# Patient Record
Sex: Female | Born: 1935 | Race: White | Hispanic: No | Marital: Married | Smoking: Former smoker
Health system: Southern US, Community
[De-identification: ages and names within clinical notes are randomized; demographics above are authoritative.]

## PROBLEM LIST (undated history)

## (undated) DIAGNOSIS — M1711 Unilateral primary osteoarthritis, right knee: Secondary | ICD-10-CM

## (undated) DIAGNOSIS — I350 Nonrheumatic aortic (valve) stenosis: Secondary | ICD-10-CM

## (undated) DIAGNOSIS — K449 Diaphragmatic hernia without obstruction or gangrene: Secondary | ICD-10-CM

## (undated) DIAGNOSIS — R0600 Dyspnea, unspecified: Secondary | ICD-10-CM

## (undated) DIAGNOSIS — F419 Anxiety disorder, unspecified: Secondary | ICD-10-CM

## (undated) DIAGNOSIS — C4490 Unspecified malignant neoplasm of skin, unspecified: Secondary | ICD-10-CM

## (undated) DIAGNOSIS — D649 Anemia, unspecified: Secondary | ICD-10-CM

## (undated) DIAGNOSIS — K219 Gastro-esophageal reflux disease without esophagitis: Secondary | ICD-10-CM

## (undated) DIAGNOSIS — M8580 Other specified disorders of bone density and structure, unspecified site: Secondary | ICD-10-CM

## (undated) DIAGNOSIS — G4700 Insomnia, unspecified: Secondary | ICD-10-CM

## (undated) DIAGNOSIS — C519 Malignant neoplasm of vulva, unspecified: Secondary | ICD-10-CM

## (undated) DIAGNOSIS — I10 Essential (primary) hypertension: Secondary | ICD-10-CM

## (undated) HISTORY — DX: Malignant neoplasm of vulva, unspecified: C51.9

## (undated) HISTORY — DX: Insomnia, unspecified: G47.00

## (undated) HISTORY — DX: Anxiety disorder, unspecified: F41.9

## (undated) HISTORY — DX: Diaphragmatic hernia without obstruction or gangrene: K44.9

## (undated) HISTORY — DX: Unilateral primary osteoarthritis, right knee: M17.11

## (undated) HISTORY — DX: Other specified disorders of bone density and structure, unspecified site: M85.80

## (undated) HISTORY — PX: EYE SURGERY: SHX253

## (undated) HISTORY — PX: TONSILLECTOMY AND ADENOIDECTOMY: SUR1326

## (undated) HISTORY — DX: Gastro-esophageal reflux disease without esophagitis: K21.9

## (undated) HISTORY — PX: VULVA SURGERY: SHX837

## (undated) HISTORY — PX: COSMETIC SURGERY: SHX468

## (undated) HISTORY — PX: TUBAL LIGATION: SHX77

---

## 2008-11-04 ENCOUNTER — Ambulatory Visit: Payer: Self-pay | Admitting: Nurse Practitioner

## 2009-10-29 ENCOUNTER — Telehealth (INDEPENDENT_AMBULATORY_CARE_PROVIDER_SITE_OTHER): Payer: Self-pay | Admitting: *Deleted

## 2009-11-18 DIAGNOSIS — R0602 Shortness of breath: Secondary | ICD-10-CM | POA: Insufficient documentation

## 2009-11-19 ENCOUNTER — Ambulatory Visit: Payer: Self-pay | Admitting: Cardiology

## 2009-11-19 DIAGNOSIS — K219 Gastro-esophageal reflux disease without esophagitis: Secondary | ICD-10-CM | POA: Insufficient documentation

## 2009-11-19 DIAGNOSIS — R011 Cardiac murmur, unspecified: Secondary | ICD-10-CM | POA: Insufficient documentation

## 2009-11-22 LAB — CONVERTED CEMR LAB: Pro B Natriuretic peptide (BNP): 53 pg/mL (ref 0.0–100.0)

## 2009-12-01 ENCOUNTER — Telehealth (INDEPENDENT_AMBULATORY_CARE_PROVIDER_SITE_OTHER): Payer: Self-pay | Admitting: *Deleted

## 2009-12-10 ENCOUNTER — Ambulatory Visit: Payer: Self-pay

## 2009-12-10 ENCOUNTER — Encounter: Payer: Self-pay | Admitting: Cardiology

## 2009-12-10 ENCOUNTER — Ambulatory Visit: Payer: Self-pay | Admitting: Cardiology

## 2009-12-10 ENCOUNTER — Ambulatory Visit (HOSPITAL_COMMUNITY): Admission: RE | Admit: 2009-12-10 | Discharge: 2009-12-10 | Payer: Self-pay | Admitting: Cardiology

## 2009-12-24 ENCOUNTER — Telehealth: Payer: Self-pay | Admitting: Cardiology

## 2009-12-28 ENCOUNTER — Telehealth (INDEPENDENT_AMBULATORY_CARE_PROVIDER_SITE_OTHER): Payer: Self-pay | Admitting: Radiology

## 2009-12-29 ENCOUNTER — Telehealth: Payer: Self-pay | Admitting: Cardiology

## 2009-12-29 ENCOUNTER — Ambulatory Visit: Payer: Self-pay | Admitting: Cardiology

## 2009-12-29 ENCOUNTER — Encounter (HOSPITAL_COMMUNITY): Admission: RE | Admit: 2009-12-29 | Discharge: 2010-02-23 | Payer: Self-pay | Admitting: Cardiology

## 2009-12-29 ENCOUNTER — Ambulatory Visit: Payer: Self-pay

## 2009-12-29 DIAGNOSIS — E782 Mixed hyperlipidemia: Secondary | ICD-10-CM | POA: Insufficient documentation

## 2010-01-04 ENCOUNTER — Telehealth: Payer: Self-pay | Admitting: Cardiology

## 2010-01-05 ENCOUNTER — Encounter (INDEPENDENT_AMBULATORY_CARE_PROVIDER_SITE_OTHER): Payer: Self-pay | Admitting: *Deleted

## 2010-01-05 LAB — CONVERTED CEMR LAB
Cholesterol: 243 mg/dL — ABNORMAL HIGH (ref 0–200)
Direct LDL: 133.4 mg/dL
Total CHOL/HDL Ratio: 3
VLDL: 27.2 mg/dL (ref 0.0–40.0)

## 2010-01-22 ENCOUNTER — Telehealth (INDEPENDENT_AMBULATORY_CARE_PROVIDER_SITE_OTHER): Payer: Self-pay | Admitting: *Deleted

## 2010-01-28 ENCOUNTER — Encounter: Payer: Self-pay | Admitting: Cardiology

## 2010-02-23 ENCOUNTER — Ambulatory Visit: Payer: Self-pay | Admitting: Internal Medicine

## 2010-02-23 DIAGNOSIS — D649 Anemia, unspecified: Secondary | ICD-10-CM | POA: Insufficient documentation

## 2010-02-24 LAB — CONVERTED CEMR LAB
BUN: 13 mg/dL (ref 6–23)
Calcium: 9.4 mg/dL (ref 8.4–10.5)
Eosinophils Relative: 2 % (ref 0.0–5.0)
Glucose, Bld: 104 mg/dL — ABNORMAL HIGH (ref 70–99)
Iron: 91 ug/dL (ref 42–145)
Lymphs Abs: 1.9 10*3/uL (ref 0.7–4.0)
MCHC: 34.7 g/dL (ref 30.0–36.0)
MCV: 93.6 fL (ref 78.0–100.0)
Monocytes Relative: 7.3 % (ref 3.0–12.0)
Neutro Abs: 3.7 10*3/uL (ref 1.4–7.7)
Platelets: 243 10*3/uL (ref 150.0–400.0)
Potassium: 4.7 meq/L (ref 3.5–5.1)
RDW: 13.9 % (ref 11.5–14.6)
Saturation Ratios: 21.7 % (ref 20.0–50.0)
Transferrin: 299.6 mg/dL (ref 212.0–360.0)
WBC: 6.3 10*3/uL (ref 4.5–10.5)

## 2010-03-05 ENCOUNTER — Ambulatory Visit: Payer: Self-pay | Admitting: Cardiology

## 2010-09-28 NOTE — Letter (Signed)
Summary: Custom - Lipid  [facility], Main Office  [address]   [place], [place] [address]   Phone: [phone]  Fax: [phone]         Jan 05, 2010 MRN: [id number]   Mackenzie Jensen [address] [place], [place]  [address]   Dear [name],  We have reviewed your cholesterol results.  They are as follows:     Total Cholesterol:    243 (Desirable: less than 200)       HDL  Cholesterol:     72.90  (Desirable: greater than 40 for men and 50 for women)       LDL Cholesterol:         (Desirable: less than 100 for low risk and less than 70 for moderate to high risk)       Triglycerides:       136.0  (Desirable: less than 150)  Our recommendations include:  LOOKS GOOD - [facility] SUGGESTS DIET   Call our office at the number listed above if you have any questions.  Lowering your LDL cholesterol is important, but it is only one of a large number of "risk factors" that may indicate that you are at risk for heart disease, stroke or other complications of hardening of the arteries.  Other risk factors include:   A.  Cigarette Smoking* B.  High Blood Pressure* C.  Obesity* D.   Low HDL Cholesterol (see yours above)* E.   Diabetes Mellitus (higher risk if your is uncontrolled) F.  Family history of premature heart disease G.  Previous history of stroke or cardiovascular disease              *These are risk factors YOU HAVE CONTROL OVER.  For more information, visit .  There is now evidence that lowering the TOTAL CHOLESTEROL AND LDL CHOLESTEROL can reduce the risk of heart disease.  The American Heart Association recommends the following guidelines for the treatment of elevated cholesterol:  1.  If there is now current heart disease and less than two risk factors, TOTAL CHOLESTEROL should be less than 200 and LDL CHOLESTEROL should be less than 100. 2.  If there is current heart disease or two or more risk factors, TOTAL CHOLESTEROL should be less  than 200 and LDL CHOLESTEROL should be less than 70.  A diet low in cholesterol, saturated fat, and calories is the cornerstone of treatment for elevated cholesterol.  Cessation of smoking and exercise are also important in the management of elevated cholesterol and preventing vascular disease.  Studies have shown that 30 to 60 minutes of physical activity most days can help lower blood pressure, lower cholesterol, and keep your weight at a healthy level.  Drug therapy is used when cholesterol levels do not respond to therapeutic lifestyle changes (smoking cessation, diet, and exercise) and remains unacceptably high.  If medication is started, it is important to have you levels checked periodically to evaluate the need for further treatment options.    Thank you,   Sander Nephew, RN for [facility] Team

## 2010-09-28 NOTE — Assessment & Plan Note (Signed)
Summary: PER CHECK OUT/SAF   Visit Type:  Follow-up Primary Provider:  Ninfa Linden, FNP - C  CC:  Dyspnea.  History of Present Illness: The patient presents for follow dyspnea. Complaints are described in previous note. Since that note she has had an extensive workup with echocardiography demonstrating normal systolic left ventricular function but perhaps some very mild diastolic dysfunction. Stress nuclear testing was negative for any evidence of ischemia. She has seen Dr. Sherene Sires and is being treated for possible reflux. In addition pulmonary function testing as planned. She is going to be starting on a diet and exercise regimen as this may be contributing to her dyspnea. She thinks she feels a little bit better since being told she had no significant cardiovascular problems. She's had no new or worsening shortness of breath, PND or orthopnea. She's had no chest pressure, neck or arm discomfort. She's had no weight gain or edema. She denies palpitations.  Current Medications (verified): 1)  Multivitamins   Tabs (Multiple Vitamin) .... Once Daily 2)  Msm 1000 Mg Caps (Methylsulfonylmethane) .... Once Daily 3)  Nexium 40 Mg Cpdr (Esomeprazole Magnesium) .... Take  One 30-60 Min Before First Meal of The Day 4)  Zovirax 400 Mg Tabs (Acyclovir) .... Once Daily 5)  Fibertab 625 Mg Tabs (Calcium Polycarbophil) .... 5 By Mouth Daily 6)  Restoril 7.5 Mg Caps (Temazepam) .... 2 By Mouth At Bedtime 7)  Ferretts 325 (106 Fe) Mg Tabs (Ferrous Fumarate) .Marland Kitchen.. 1 Two Times A Day 8)  Pepcid Ac Maximum Strength 20 Mg Tabs (Famotidine) .... One At Bedtime  Allergies (verified): 1)  ! Novocain  Past History:  Past Medical History: Reviewed history from 02/23/2010 and no changes required. GERD/Hiatal hernia Remote PUD Anxiety Insomnia Osteopenia Hx of Phen Fen use (got settlement from that due to heart problem?) Dyspnea......................................Marland KitchenWert      - Dec 2011 onset      - ECHO  12/10/09 c/w G I diastol dysfunction with nl LA size and mild PAH  Past Surgical History: Reviewed history from 11/19/2009 and no changes required. Plastic surgery (facial) Tubal ligation  Review of Systems       As stated in the HPI and negative for all other systems.   Vital Signs:  Patient profile:   77 year old female Height:      65.5 inches Weight:      201 pounds BMI:     33.06 Pulse rate:   80 / minute Resp:     18 per minute BP sitting:   128 / 74  (right arm)  Vitals Entered By: Marrion Coy, CNA (March 05, 2010 12:07 PM)  Physical Exam  General:  Well developed, well nourished, in no acute distress. Head:  normocephalic and atraumatic Neck:  Neck supple, no JVD. No masses, thyromegaly or abnormal cervical nodes. Lungs:  Clear bilaterally to auscultation and percussion. Heart:  Non-displaced PMI, chest non-tender; regular rate and rhythm, S1, S2 without murmurs, rubs or gallops. Carotid upstroke normal, no bruit. Normal abdominal aortic size, no bruits. Femorals normal pulses, no bruits. Pedals normal pulses. No edema, no varicosities. Abdomen:  Bowel sounds positive; abdomen soft and non-tender without masses, organomegaly, or hernias noted. No hepatosplenomegaly. Msk:  Back normal, normal gait. Muscle strength and tone normal. Extremities:  No clubbing or cyanosis. Neurologic:  Alert and oriented x 3. Skin:  Intact without lesions or rashes. Psych:  Normal affect.   Impression & Recommendations:  Problem # 1:  DYSPNEA (ICD-786.05) At  this point there does not appear to be a cardiac etiology. No further cardiovascular testing is suggested. She will continue to follow for pulmonary evaluation.  Problem # 2:  MURMUR (ICD-785.2) As above there is no structural heart disease identified. No further workup was suggested.  Patient Instructions: 1)  Your physician recommends that you schedule a follow-up appointment as needed 2)  Your physician recommends that you  continue on your current medications as directed. Please refer to the Current Medication list given to you today.

## 2010-09-28 NOTE — Assessment & Plan Note (Signed)
Summary: Cardiology Nuclear Study/call to [phone]  Nuclear Med Background Indications for Stress Test: Evaluation for Ischemia   History: Echo  History Comments: 4/11 Echo:EF=55-60%  Symptoms: DOE, Rapid HR    Nuclear Pre-Procedure Cardiac Risk Factors: Family History - CAD, History of Smoking, Obesity Caffeine/Decaff Intake: None NPO After: 7:00 PM Lungs: Clear IV 0.9% NS with Angio Cath: 22g     IV Site: (R) AC IV Started by: Irean Hong RN Chest Size (in) 40     Cup Size C     Height (in): 65.5 Weight (lb): 197 BMI: 32.40  Nuclear Med Study 1 or 2 day study:  1 day     Stress Test Type:  Stress Reading MD:  Marca Ancona, MD     Referring MD:  Rollene Rotunda, MD Resting Radionuclide:  Technetium 69m Tetrofosmin     Resting Radionuclide Dose:  11 mCi  Stress Radionuclide:  Technetium 36m Tetrofosmin     Stress Radionuclide Dose:  33 mCi   Stress Protocol Exercise Time (min):  5:30 min     Max HR:  122 bpm     Predicted Max HR:  147 bpm  Max Systolic BP: 173 mm Hg     Percent Max HR:  82.99 %     METS: 6.8 Rate Pressure Product:  40102    Stress Test Technologist:  Rea College CMA-N     Nuclear Technologist:  Domenic Polite CNMT  Rest Procedure  Myocardial perfusion imaging was performed at rest 45 minutes following the intravenous administration of Myoview Technetium 69m Tetrofosmin.  Stress Procedure  The patient exercised for 5:30.  The patient stopped due to fatigue and shortness of breath with O2 Sats of 96-97%.   She denied any chest pain.  There were no significant ST-T wave changes.  Myoview was injected at peak exercise and myocardial perfusion imaging was performed after a brief delay.  QPS Raw Data Images:  Normal; no motion artifact; normal heart/lung ratio. Stress Images:  NI: Uniform and normal uptake of tracer in all myocardial segments. Rest Images:  Normal homogeneous uptake in all areas of the myocardium. Subtraction (SDS):  There is no  evidence of scar or ischemia. Transient Ischemic Dilatation:  1.22  (Normal <1.22)  Lung/Heart Ratio:  .33  (Normal <0.45)  Quantitative Gated Spect Images QGS EDV:  72 ml QGS ESV:  21 ml QGS EF:  71 % QGS cine images:  No wall motion abnormalities.    Overall Impression  Exercise Capacity: Fair exercise capacity. BP Response: Normal blood pressure response. Clinical Symptoms: Short of breath and fatigue, no chest pain.  ECG Impression: No significant ST segment change suggestive of ischemia. Overall Impression: No evidence for scar or ischemia, normal systolic function.  Overall Impression Comments: Submaximal (82% MPHR).   Appended Document: Cardiology Nuclear Study/call to [phone] Submaximal stress but heart rate was close.  No further cardiac work up.  Suggest pulmonary follow up.  Appended Document: Cardiology Nuclear Study/call to [phone] pt aware of results and will be referred to pulmonary for eval of dyspnea

## 2010-09-28 NOTE — Progress Notes (Signed)
Summary: test results  Phone Note Call from Patient Call back at Home Phone [phone] Call back at [phone]   Caller: Patient Reason for Call: Talk to Nurse Summary of Call: how soon can pt hear results of stress test.  Initial call taken by: Lorne Skeens,  Dec 29, 2009 2:33 PM  Follow-up for Phone Call        pt aware that results are not ready yet.  Pt having to drive to [facility] to see her dentist because her temporary teeth popped off.  Assured pt I will call her with the results as soon as I get them. Follow-up by: Charolotte Capuchin, RN,  Dec 29, 2009 2:57 PM

## 2010-09-28 NOTE — Progress Notes (Signed)
Summary: pt has questions about letter she got 3:30pm NA  Phone Note Call from Patient Call back at Home Phone [phone]   Caller: Patient Reason for Call: Talk to Nurse, Talk to Doctor Summary of Call: pt calling about letter she got she has questions Initial call taken by: Omer Jack,  Jan 22, 2010 12:31 PM  Follow-up for Phone Call        Called patient and left message on machine to call back 2:10 pm 01/22/2010  Sander Nephew, RN  LM for pt to call back if she still has a question about letter Follow-up by: Charolotte Capuchin, RN,  January 27, 2010 2:09 PM

## 2010-09-28 NOTE — Assessment & Plan Note (Signed)
Summary: NP6/DYSPNEA-MB   Visit Type:  Initial Consult Primary Provider:  Ninfa Linden, FNP - C  CC:  Dyspnea.  History of Present Illness: The patient presents for evaluation of dyspnea. She has no prior cardiac history. She said she was feeling well until getting a tooth pulled in December of last year. She noticed dyspnea following this and thought it might be related to antibiotics that she was taking at that time. A workup was started and she was noted to be anemic. She had a workup at [facility] with upper, lower GI and capsule endoscopy without an etiology. She does have a hiatal hernia. Another workup included pulmonary function testing though the patient doesn't recall this. I see that report but not the date or place. She had some mild restrictive disease with an apparent response to bronchodilators. Since that time the patient continues to get dyspneic with mild activity. She reports being short of breath walking 25 yards on level ground. She has to stop for a minute or so to recover. She does not describe resting shortness of breath, PND or orthopnea. She denies chest pressure, neck or arm discomfort. She doesn't notice any palpitations and has had no presyncope or syncope.  Of note years ago the patient took a diet combination Fen/Phen and apparently had an abnormal echo though she doesn't recall the details. She has had no other cardiac workup.  Current Medications (verified): 1)  Multivitamins   Tabs (Multiple Vitamin) .... Once Daily 2)  Acetaminophen 325 Mg  Tabs (Acetaminophen) .... Every Morning 3)  Msm 1000 Mg Caps (Methylsulfonylmethane) .... Once Daily 4)  Nexium 40 Mg Cpdr (Esomeprazole Magnesium) .... Once Daily 5)  Zovirax 400 Mg Tabs (Acyclovir) .... Once Daily 6)  Nyquil Cold & Flu 15-6.25-325 Mg/16ml Liqd (Dm-Doxylamine-Acetaminophen) .... At Bedtime (For Sleep) 7)  Proair Hfa 108 (90 Base) Mcg/act Aers (Albuterol Sulfate) .... 2 Puffs Every4-6 Hrs As Needed 8)   Trazodone Hcl 100 Mg Tabs (Trazodone Hcl) .... At Bedtime 9)  Alprazolam 0.25 Mg Tabs (Alprazolam) .... At Bedtime As Needed  Allergies (verified): 1)  ! Novocain  Past History:  Past Medical History: GERD/Hiatal hernia Remote PUD Anxiety Insomnia Osteopenia Hx of Phen Fen use (got settlement from that due to heart problem?)  Past Surgical History: Plastic surgery (facial) Tubal ligation  Family History: Father died of a microinfarction at age 68. Her brother had a myocardial infarction at age 65.  Social History: Married  Tobacco Use - She quit smoking in 1984 after one pack per day for 25 years. Alcohol Use - yes occasionally  Review of Systems       Positive for joint pain, reflux. Otherwise as stated in the history of present illness negative for all other systems.  Vital Signs:  Patient profile:   77 year old female Height:      66 inches Weight:      202 pounds BMI:     32.72 Pulse rate:   80 / minute Resp:     18 per minute BP sitting:   144 / 88  (right arm)  Vitals Entered By: Marrion Coy, CNA (November 19, 2009 11:43 AM)  Physical Exam  General:  Well developed, well nourished, in no acute distress. Head:  normocephalic and atraumatic Eyes:  PERRLA/EOM intact; conjunctiva and lids normal. Mouth:  Teeth, gums and palate normal. Oral mucosa normal. Neck:  Neck supple, no JVD. No masses, thyromegaly or abnormal cervical nodes. Chest Wall:  no deformities or breast  masses noted Lungs:  Clear bilaterally to auscultation and percussion. Abdomen:  Bowel sounds positive; abdomen soft and non-tender without masses, organomegaly, or hernias noted. No hepatosplenomegaly. Msk:  Back normal, normal gait. Muscle strength and tone normal. Extremities:  No clubbing or cyanosis. Neurologic:  Alert and oriented x 3. Skin:  Intact without lesions or rashes. Cervical Nodes:  no significant adenopathy Axillary Nodes:  no significant adenopathy Inguinal Nodes:  no  significant adenopathy Psych:  Normal affect.   Detailed Cardiovascular Exam  Neck    Carotids: Carotids full and equal bilaterally without bruits.      Neck Veins: Normal, no JVD.    Heart    Inspection: no deformities or lifts noted.      Palpation: normal PMI with no thrills palpable.      Auscultation: regular rate and rhythm, S1, S2 , 2/6 apical systolic murmur early peaking, radiating slightly at the aortic outflow tract, no diastolic murmurs  Vascular    Abdominal Aorta: no palpable masses, pulsations, or audible bruits.      Femoral Pulses: normal femoral pulses bilaterally.      Pedal Pulses: normal pedal pulses bilaterally.      Radial Pulses: normal radial pulses bilaterally.      Peripheral Circulation: no clubbing, cyanosis, or edema noted with normal capillary refill.     EKG  Procedure date:  11/19/2009  Findings:      sinus rhythm, rate 80, axis within normal limits, intervals within normal limits, nonspecific T-wave flattening.  Impression & Recommendations:  Problem # 1:  DYSPNEA (ICD-786.05) This he is a predominant complaint. I do see some mildly abnormal pulmonary function studies. She does have findings of a murmur and a history of Fen/Phen use. I will start with a BNP and echo. Given her risk factors it included family history if this is normal I will order a stress test. If none of this is revealing then pulmonary referral is indicated. Orders: TLB-BNP (B-Natriuretic Peptide) (83880-BNPR) Echocardiogram (Echo)  Problem # 2:  MURMUR (ICD-785.2) This will be evaluated as above.  Problem # 3:  GERD (ICD-530.81) The patient is on Nexium. Certainly reflux could be contributing to symptoms. I will consider this if no other etiology is identified and suggest possibly switching medications.  Other Orders: EKG w/ Interpretation (93000)  Patient Instructions: 1)  Your physician recommends that you schedule a follow-up appointment in: 2 months with Dr  Antoine Poche 2)  Your physician recommends that you have lab work today BNP for 786.51 3)  Your physician recommends that you continue on your current medications as directed. Please refer to the Current Medication list given to you today. 4)  Your physician has requested that you have an echocardiogram.  Echocardiography is a painless test that uses sound waves to create images of your heart. It provides your doctor with information about the size and shape of your heart and how well your heart's chambers and valves are working.  This procedure takes approximately one hour. There are no restrictions for this procedure.

## 2010-09-28 NOTE — Progress Notes (Signed)
Summary: returning call about getting more test  Phone Note Call from Patient Call back at Home Phone [phone]   Caller: Patient Summary of Call: Pt calling to get information on more test  Initial call taken by: Judie Grieve,  December 24, 2009 3:53 PM  Follow-up for Phone Call        pt aware of results and need for stress test.  Instructions for the stress test given via phone and will be mailed to pts home. Follow-up by: Charolotte Capuchin, RN,  December 24, 2009 4:37 PM

## 2010-09-28 NOTE — Miscellaneous (Signed)
  Clinical Lists Changes  Observations: Added new observation of NUCLEAR NOS: Exercise Capacity: Fair exercise capacity. BP Response: Normal blood pressure response. Clinical Symptoms: Short of breath and fatigue, no chest pain.  ECG Impression: No significant ST segment change suggestive of ischemia. Overall Impression: No evidence for scar or ischemia, normal systolic function.  Overall Impression Comments: Submaximal (82% MPHR).     (12/29/2009 11:49) Added new observation of ECHOINTERP:  - Left ventricle: The cavity size was normal. There was mild focal       basal hypertrophy of the septum. Systolic function was normal. The       estimated ejection fraction was in the range of 55% to 60%. Wall       motion was normal; there were no regional wall motion       abnormalities. Doppler parameters are consistent with abnormal       left ventricular relaxation (grade 1 diastolic dysfunction).     - Aortic valve: Mild regurgitation.     - Pulmonary arteries: Systolic pressure was mildly increased. PA       peak pressure: 34mm Hg (S). (12/10/2009 11:50)      Nuclear Study  Procedure date:  12/29/2009  Findings:      Exercise Capacity: Fair exercise capacity. BP Response: Normal blood pressure response. Clinical Symptoms: Short of breath and fatigue, no chest pain.  ECG Impression: No significant ST segment change suggestive of ischemia. Overall Impression: No evidence for scar or ischemia, normal systolic function.  Overall Impression Comments: Submaximal (82% MPHR).      Echocardiogram  Procedure date:  12/10/2009  Findings:       - Left ventricle: The cavity size was normal. There was mild focal       basal hypertrophy of the septum. Systolic function was normal. The       estimated ejection fraction was in the range of 55% to 60%. Wall       motion was normal; there were no regional wall motion       abnormalities. Doppler parameters are consistent with abnormal   left ventricular relaxation (grade 1 diastolic dysfunction).     - Aortic valve: Mild regurgitation.     - Pulmonary arteries: Systolic pressure was mildly increased. PA       peak pressure: 34mm Hg (S).

## 2010-09-28 NOTE — Assessment & Plan Note (Signed)
Summary: Pulmonary/ new pt eval    Visit Type:  Initial Consult Copy to:  Dr. Antoine Poche Primary Provider/Referring Provider:  Ninfa Linden, FNP - C  CC:  Dyspnea.  History of Present Illness: 77 yowf quit smoking in 1985 with new onset sob Dec 2010  February 23, 2010 cc sob somewhat better after admit to [facility] and tx x one unit and started on Iron with no source of bleeding identified and hgb built back up to > 11 but still sob with housework with no change in weight, does ok walking flat and sleeping, no cough or wheeze or overt hb though does have a hx of severe noct hb and takes nexium after supper every day to control it.  No real variability in terms of ex tol.  Pt denies any significant sore throat, dysphagia, itching, sneezing,  nasal congestion or excess secretions,  fever, chills, sweats, unintended wt loss, pleuritic or exertional cp, hempoptysis,  orthopnea pnd or leg swelling Pt also denies any obvious fluctuation in symptoms with weather or environmental change or other alleviating or aggravating factors.       Current Medications (verified): 1)  Multivitamins   Tabs (Multiple Vitamin) .... Once Daily 2)  Msm 1000 Mg Caps (Methylsulfonylmethane) .... Once Daily 3)  Nexium 40 Mg Cpdr (Esomeprazole Magnesium) .... Once Daily 4)  Zovirax 400 Mg Tabs (Acyclovir) .... Once Daily 5)  Fibertab 625 Mg Tabs (Calcium Polycarbophil) .Marland Kitchen.. 1 By Mouth Daily 6)  Restoril 7.5 Mg Caps (Temazepam) .Marland Kitchen.. 1 By Mouth Daily 7)  Ferretts 325 (106 Fe) Mg Tabs (Ferrous Fumarate) .Marland Kitchen.. 1 Two Times A Day  Allergies (verified): 1)  ! Novocain  Past History:  Past Medical History: GERD/Hiatal hernia Remote PUD Anxiety Insomnia Osteopenia Hx of Phen Fen use (got settlement from that due to heart problem?) Dyspnea......................................Marland KitchenWert      - Dec 2011 onset      - ECHO 12/10/09 c/w G I diastol dysfunction with nl LA size and mild PAH  Family History: Father died of a  microinfarction at age 21. Her brother had a myocardial infarction at age 75. Negative for respiratory diseases or atopy   Social History: Married  Retired Tobacco Use - She quit smoking in 1985 after one pack per day for 25 years. Alcohol Use - yes occasionally  Review of Systems       The patient complains of shortness of breath with activity, tooth/dental problems, sneezing, and joint stiffness or pain.  The patient denies shortness of breath at rest, productive cough, non-productive cough, coughing up blood, chest pain, irregular heartbeats, acid heartburn, indigestion, loss of appetite, weight change, abdominal pain, difficulty swallowing, sore throat, headaches, nasal congestion/difficulty breathing through nose, itching, ear ache, anxiety, depression, hand/feet swelling, rash, change in color of mucus, and fever.    Vital Signs:  Patient profile:   77 year old female Weight:      201 pounds O2 Sat:      96 % on Room air Temp:     98.0 degrees F oral Pulse rate:   76 / minute BP sitting:   116 / 76  (left arm)  Vitals Entered By: Vernie Murders (February 23, 2010 10:37 AM)  O2 Flow:  Room air  Physical Exam  Additional Exam:  amb wf nad wt 197 > 201 February 24, 2010 HEENT mild turbinate edema.  Oropharynx no thrush or excess pnd or cobblestoning.  No JVD or cervical adenopathy. Mild accessory muscle hypertrophy. Trachea midline, nl  thryroid. Chest was hyperinflated by percussion with diminished breath sounds and moderate increased exp time without wheeze. Hoover sign positive at mid inspiration. Regular rate and rhythm without murmur gallop or rub or increase P2 or edema.  Abd: no hsm, nl excursion. Ext warm without cyanosis or clubbing.    ron                      91 ug/dL                    63-016   Transferrin               299.6 mg/dL                 010.9-323.5   Iron Saturation           21.7 %                      20.0-50.0  Tests: (2) BMP (METABOL)   Sodium                     142 mEq/L                   135-145   Potassium                 4.7 mEq/L                   3.5-5.1   Chloride                  104 mEq/L                   96-112   Carbon Dioxide            32 mEq/L                    19-32   Glucose              [H]  104 mg/dL                   57-32   BUN                       13 mg/dL                    2-02   Creatinine                0.7 mg/dL                   5.4-2.7   Calcium                   9.4 mg/dL                   0.6-23.7   GFR                       87.05 mL/min                >60  Tests: (3) CBC Platelet w/Diff (CBCD)   White Cell Count          6.3 K/uL                    4.5-10.5   Red Cell Count  4.36 Mil/uL                 3.87-5.11   Hemoglobin                14.2 g/dL                   16.1-09.6   Hematocrit                40.8 %                      36.0-46.0   MCV                       93.6 fl                     78.0-100.0   MCHC                      34.7 g/dL                   04.5-40.9   RDW                       13.9 %                      11.5-14.6   Platelet Count            243.0 K/uL                  150.0-400.0   Neutrophil %              59.7 %                      43.0-77.0   Lymphocyte %              30.5 %                      12.0-46.0   Monocyte %                7.3 %                       3.0-12.0   Eosinophils%              2.0 %                       0.0-5.0   Basophils %               0.5 %                       0.0-3.0   Neutrophill Absolute      3.7 K/uL                    1.4-7.7   Lymphocyte Absolute       1.9 K/uL                    0.7-4.0   Monocyte Absolute         0.5 K/uL                    0.1-1.0  Eosinophils, Absolute  0.1 K/uL                    0.0-0.7   Basophils Absolute        0.0 K/uL                    0.0-0.1  Tests: (4) TSH (TSH)   FastTSH                   1.33 uIU/mL                 0.35-5.50  CXR  Procedure date:  02/23/2010  Findings:         Comparison: None.   Findings: The lungs are clear.  There is mild peribronchial thickening present.  The heart is mildly enlarged.  A small to moderate sized hiatal hernia is noted.  No acute bony abnormality is seen.   IMPRESSION:   1.  No active lung disease. 2.  Mild cardiomegaly. 3.  Small to moderate sized hiatal hernia.  Impression & Recommendations:  Problem # 1:  DYSPNEA (ICD-786.05)  When respiratory symptoms begin well after a patient reports complete smoking cessation,  it is very hard to "blame" COPD  ie it doesn't make any more sense than hearing a  [employer]SC[employer] driver wrecked his car while driving his kids to school or a Careers adviser sliced his hand off carving [place].  Once the high risk activity stops,  the symptoms should not suddenly erupt.  If so, the differential diagnosis should include  obesity/deconditioning,  LPR/Reflux, CHF, or side effect of medications   Believe this is multifactorial with only very mild copd  For now work on treating gerd with optimal ppi/ diet  and reconditioning / wt loss >  f/u with full pft's  Medications Added to Medication List This Visit: 1)  Nexium 40 Mg Cpdr (Esomeprazole magnesium) .... Take  one 30-60 min before first meal of the day 2)  Ferretts 325 (106 Fe) Mg Tabs (Ferrous fumarate) .Marland Kitchen.. 1 two times a day 3)  Pepcid Ac Maximum Strength 20 Mg Tabs (Famotidine) .... One at bedtime  Other Orders: T-2 View CXR (71020TC) TLB-IBC Pnl (Iron/FE;Transferrin) (83550-IBC) TLB-BMP (Basic Metabolic Panel-BMET) (80048-METABOL) TLB-CBC Platelet - w/Differential (85025-CBCD) TLB-TSH (Thyroid Stimulating Hormone) (84443-TSH)  Patient Instructions: 1)  Change nexium  Take  one 30-60 min before first meal of the day  2)  and take pepcid 20 mg one at bedtime 3)  GERD (REFLUX)  is a common cause of respiratory symptoms. It commonly presents without heartburn and can be treated with medication, but also with lifestyle changes including avoidance of  late meals, excessive alcohol, smoking cessation, and avoid fatty foods, chocolate, peppermint, colas, red wine, and acidic juices such as orange juice. NO MINT OR MENTHOL PRODUCTS SO NO COUGH DROPS  4)  USE SUGARLESS CANDY INSTEAD (jolley ranchers)  5)  NO OIL BASED VITAMINS  6)  Weight control is simply a matter of calorie balance which needs to be tilted in your favor by eating less and exercising more.  To get the most out of exercise, you need to be continuously aware that you are short of breath, but never out of breath, for 30 minutes daily. As you improve, it will actually be easier for you to do the same amount in  30 minutes so always push to the level where you are short of breath.  If this does not result in gradual weight reduction,  I  recommend  a nutritionist for a food diary 7)  Please schedule a follow-up appointment in 4 weeks, sooner if needed with PFT's on return

## 2010-09-28 NOTE — Miscellaneous (Signed)
Summary: Orders Update  nuc study  Clinical Lists Changes  Orders: Added new Referral order of Nuclear Stress Test (Nuc Stress Test) - Signed

## 2010-09-28 NOTE — Progress Notes (Signed)
  Faxed all Cardiac over to [facility] to fax [phone] [facility]  December 01, 2009 1:21 PM

## 2010-09-28 NOTE — Progress Notes (Signed)
  Recieved Records from [facility] forwarded to [facility] for Np appt with Hochrein 11/19/09 their was not a ROI with this pw. and I did not fax one over from the patient. Cala Bradford [place]ore  October 29, 2009 10:56 AM'

## 2010-09-28 NOTE — Progress Notes (Signed)
Summary: Nuc Pre-Procedure  Phone Note Outgoing Call Call back at Home Phone [phone]   Call placed by: Henrine Screws Call placed to: Patient Reason for Call: Confirm/change Appt Summary of Call: Reviewed information on [employer] Information Sheet (see scanned document for further details).  Spoke with patient.     Nuclear Med Background Indications for Stress Test: Evaluation for Ischemia   History: Echo  History Comments: 4/11- Echo- EF= 55-60%  Symptoms: DOE, SOB    Nuclear Pre-Procedure Cardiac Risk Factors: Family History - CAD, History of Smoking Height (in): 66

## 2010-09-28 NOTE — Progress Notes (Signed)
Summary: discuss test report  Phone Note Call from Patient Call back at Home Phone [phone] Call back at [phone]   Caller: Patient Reason for Call: Talk to Nurse, Lab or Test Results Details for Reason: discuss test report.  Initial call taken by: Lorne Skeens,  Jan 04, 2010 10:47 AM  Follow-up for Phone Call        pt aware of results and will be referred to pulmonary for eval of sob. Follow-up by: Charolotte Capuchin, RN,  Jan 04, 2010 4:11 PM

## 2011-06-06 ENCOUNTER — Telehealth: Payer: Self-pay | Admitting: Cardiology

## 2011-06-06 NOTE — Telephone Encounter (Signed)
All Cardiac faxed to [facility] @ [phone]   06/06/11/km

## 2012-02-15 DIAGNOSIS — C519 Malignant neoplasm of vulva, unspecified: Secondary | ICD-10-CM | POA: Insufficient documentation

## 2012-11-22 ENCOUNTER — Ambulatory Visit (INDEPENDENT_AMBULATORY_CARE_PROVIDER_SITE_OTHER): Payer: Medicare Other | Admitting: Cardiology

## 2012-11-22 ENCOUNTER — Encounter: Payer: Self-pay | Admitting: Cardiology

## 2012-11-22 VITALS — BP 131/75 | HR 82 | Ht 66.0 in | Wt 206.4 lb

## 2012-11-22 DIAGNOSIS — E782 Mixed hyperlipidemia: Secondary | ICD-10-CM

## 2012-11-22 DIAGNOSIS — R011 Cardiac murmur, unspecified: Secondary | ICD-10-CM

## 2012-11-22 DIAGNOSIS — R0602 Shortness of breath: Secondary | ICD-10-CM

## 2012-11-22 NOTE — Addendum Note (Signed)
Addended by: Demetrios Loll on: 11/22/2012 03:38 PM   Modules accepted: Orders

## 2012-11-22 NOTE — Progress Notes (Signed)
HPI The patient presents for evaluation of some dizziness and dyspnea. I saw her a little less than 3 years ago for dyspnea. She had a history of Phen-Fen use. However, echocardiography demonstrated no significant abnormalities other than some mild diastolic dysfunction. She has not had any acute change in his symptoms though she will occasionally get short of breath with some activities. She denies any resting shortness of breath and is not describing PND or orthopnea. She does have any palpitations, presyncope or syncope. However, occasionally she will have a little dizziness lying flat back and some mild orthostatic symptoms particularly in the morning. She doesn't do a lot of activities and does have some increased dyspnea on exertion which she relates to being very inactive. She doesn't exercise routinely but she does some light chores.  Allergies  Allergen Reactions  . Procaine Hcl     Current Outpatient Prescriptions  Medication Sig Dispense Refill  . ferrous sulfate 325 (65 FE) MG tablet Take 325 mg by mouth daily with breakfast.      . Methylsulfonylmethane (MSM) 1000 MG CAPS Take by mouth daily.      Marland Kitchen acyclovir (ZOVIRAX) 200 MG capsule       . NEXIUM 40 MG capsule       . temazepam (RESTORIL) 15 MG capsule        No current facility-administered medications for this visit.    Past Medical History  Diagnosis Date  . GERD (gastroesophageal reflux disease)   . Hiatal hernia   . Anxiety   . Insomnia   . Osteopenia   . Vulvar cancer     Past Surgical History  Procedure Laterality Date  . Vulva surgery    . Cosmetic surgery    . Tubal ligation    . Tonsillectomy and adenoidectomy      ROS:  Positive for several colds this winter.  Positive for fatigue. Positive for cold feet. Otherwise as stated in the HPI and negative for all other systems.  PHYSICAL EXAM BP 136/90  Pulse 80  Ht 5\' 6"  (1.676 m)  Wt 206 lb 6.4 oz (93.622 kg)  BMI 33.33 kg/m2 GENERAL:  Well  appearing HEENT:  Pupils equal round and reactive, fundi not visualized, oral mucosa unremarkable NECK:  No jugular venous distention, waveform within normal limits, carotid upstroke brisk and symmetric, no bruits, no thyromegaly LYMPHATICS:  No cervical, inguinal adenopathy LUNGS:  Clear to auscultation bilaterally BACK:  No CVA tenderness CHEST:  Unremarkable HEART:  PMI not displaced or sustained,S1 and S2 within normal limits, no S3, no S4, no clicks, no rubs, no murmurs ABD:  Flat, positive bowel sounds normal in frequency in pitch, no bruits, no rebound, no guarding, no midline pulsatile mass, no hepatomegaly, no splenomegaly EXT:  2 plus pulses throughout, no edema, no cyanosis no clubbing SKIN:  No rashes no nodules NEURO:  Cranial nerves II through XII grossly intact, motor grossly intact throughout PSYCH:  Cognitively intact, oriented to person place and time  EKG:  Sinus rhythm, rate 80, axis within normal limits, intervals within normal limits, no acute ST-T wave changes.   ASSESSMENT AND PLAN  DIZZINESS:  There is no evidence of orthostasis. I do not suspect a cardiac etiology such as arrhythmia. I would not suggest further evaluation.  SOB:  She does get some dyspnea with exertion. However, she had this complaint almost 3 years ago and had a negative workup with some mild diastolic dysfunction. I will check a BNP level to  see if this might be contributing currently. At this point no change in therapy is indicated.

## 2012-11-22 NOTE — Patient Instructions (Signed)
Your physician recommends that you return for lab work in: today  Your physician recommends that you schedule a follow-up appointment in: as needed   

## 2012-11-28 ENCOUNTER — Telehealth: Payer: Self-pay | Admitting: Cardiology

## 2012-11-28 NOTE — Telephone Encounter (Signed)
Left message of NL results

## 2012-11-28 NOTE — Telephone Encounter (Signed)
Follow up  ° ° ° ° °Pt is returning your call  °

## 2012-11-28 NOTE — Telephone Encounter (Signed)
New problem    Pt stated he had a vm saying call our office back. Pt stated no one left their name. So she is returning someone's phone call.

## 2014-01-06 DIAGNOSIS — B001 Herpesviral vesicular dermatitis: Secondary | ICD-10-CM | POA: Insufficient documentation

## 2014-01-06 DIAGNOSIS — H269 Unspecified cataract: Secondary | ICD-10-CM | POA: Insufficient documentation

## 2014-01-27 ENCOUNTER — Telehealth: Payer: Self-pay | Admitting: Cardiology

## 2014-01-27 NOTE — Telephone Encounter (Signed)
Pt calling extremely SOB with any activity.  She is not SOB at rest.  She has had SOB for along time per her report but this is much worse per her report.  She reports no history of pulmonary problems.  Advised she should report to the ED for further evaluation.  She states she doesn't know if she will go or not and will talk to her daughter first.

## 2014-01-27 NOTE — Telephone Encounter (Signed)
New Message  Pt called states that she experincing SOB very often.. States she is experincing SOB at the current moment.  She also states that when she gets in the heat she can hardly breath/SR

## 2014-02-14 ENCOUNTER — Encounter: Payer: Self-pay | Admitting: *Deleted

## 2014-02-20 ENCOUNTER — Encounter: Payer: Self-pay | Admitting: Cardiology

## 2014-02-20 ENCOUNTER — Ambulatory Visit (INDEPENDENT_AMBULATORY_CARE_PROVIDER_SITE_OTHER): Payer: Medicare Other | Admitting: Cardiology

## 2014-02-20 VITALS — BP 120/80 | HR 72 | Ht 66.0 in | Wt 202.0 lb

## 2014-02-20 DIAGNOSIS — R0602 Shortness of breath: Secondary | ICD-10-CM

## 2014-02-20 DIAGNOSIS — R0609 Other forms of dyspnea: Secondary | ICD-10-CM

## 2014-02-20 DIAGNOSIS — R011 Cardiac murmur, unspecified: Secondary | ICD-10-CM

## 2014-02-20 DIAGNOSIS — R0989 Other specified symptoms and signs involving the circulatory and respiratory systems: Secondary | ICD-10-CM

## 2014-02-20 DIAGNOSIS — R0600 Dyspnea, unspecified: Secondary | ICD-10-CM

## 2014-02-20 NOTE — Progress Notes (Signed)
HPI The patient presents for evaluation of dyspnea. She has had this complaint in the past. I have evaluated her in the past with stress perfusion study in 2011 which demonstrated a well preserved ejection fraction and no evidence ischemia. She had a BNP last year that was normal. Echocardiography in 2011 was unremarkable. She was on some eyedrops after eye surgery this year and she was even more short of breath until these were changed. However, her daughter does notice increased dyspnea with moderate exertion. She's not describing PND or orthopnea. She gets short of breath climbing a flight of stairs and has to recover. She's not describing chest pressure, neck or arm discomfort. She's not having any new palpitations, presyncope or syncope.  Allergies  Allergen Reactions  . Procaine Hcl     Current Outpatient Prescriptions  Medication Sig Dispense Refill  . acyclovir (ZOVIRAX) 200 MG capsule       . ALPRAZolam (XANAX) 0.25 MG tablet       . dorzolamide (TRUSOPT) 2 % ophthalmic solution       . escitalopram (LEXAPRO) 10 MG tablet       . ferrous sulfate 325 (65 FE) MG tablet Take 325 mg by mouth daily with breakfast.      . Methylsulfonylmethane (MSM) 1000 MG CAPS Take by mouth daily.      Marland Kitchen NEXIUM 40 MG capsule       . temazepam (RESTORIL) 15 MG capsule        No current facility-administered medications for this visit.    Past Medical History  Diagnosis Date  . GERD (gastroesophageal reflux disease)   . Hiatal hernia   . Anxiety   . Insomnia   . Osteopenia   . Vulvar cancer     Past Surgical History  Procedure Laterality Date  . Vulva surgery    . Cosmetic surgery    . Tubal ligation    . Tonsillectomy and adenoidectomy      ROS:  A stated in the HPI and negative for all other systems.  PHYSICAL EXAM BP 120/80  Pulse 72  Ht 5\' 6"  (1.676 m)  Wt 202 lb (91.627 kg)  BMI 32.62 kg/m2 GENERAL:  Well appearing HEENT:  Pupils equal round and reactive, fundi not  visualized, oral mucosa unremarkable NECK:  No jugular venous distention, waveform within normal limits, carotid upstroke brisk and symmetric, no bruits, no thyromegaly LYMPHATICS:  No cervical, inguinal adenopathy LUNGS:  Clear to auscultation bilaterally BACK:  No CVA tenderness CHEST:  Unremarkable HEART:  PMI not displaced or sustained,S1 and S2 within normal limits, no S3, no S4, no clicks, no rubs, no murmurs ABD:  Flat, positive bowel sounds normal in frequency in pitch, no bruits, no rebound, no guarding, no midline pulsatile mass, no hepatomegaly, no splenomegaly EXT:  2 plus pulses throughout, no edema, no cyanosis no clubbing SKIN:  No rashes no nodules NEURO:  Cranial nerves II through XII grossly intact, motor grossly intact throughout PSYCH:  Cognitively intact, oriented to person place and time  EKG:  Sinus rhythm, rate 72, axis within normal limits, intervals within normal limits, no acute ST-T wave changes. And  02/20/2014 He is a him he really has not is no   ASSESSMENT AND PLAN  SOB:  I do not see any objective evidence that this is a cardiac etiology. There was some very mild pulmonary hypertension on previous echo. I will start with an echocardiogram and a BNP level. If this is negative I will  order a [employer].   If this is negative no further work up will be indicated.

## 2014-02-20 NOTE — Patient Instructions (Signed)
1. Your physician recommends that you schedule a follow-up appointment in: as needed  2. We are scheduling an echo to determine the source causing your Shortness of breath  3. Have these labs done as soon as you can

## 2014-02-21 ENCOUNTER — Encounter: Payer: Self-pay | Admitting: Cardiology

## 2014-03-03 ENCOUNTER — Ambulatory Visit (HOSPITAL_COMMUNITY)
Admission: RE | Admit: 2014-03-03 | Discharge: 2014-03-03 | Disposition: A | Discharge status: Home / Self Care | Payer: Medicare Other | Source: Ambulatory Visit | Attending: Cardiology | Admitting: Cardiology

## 2014-03-03 DIAGNOSIS — R0609 Other forms of dyspnea: Secondary | ICD-10-CM | POA: Insufficient documentation

## 2014-03-03 DIAGNOSIS — R0989 Other specified symptoms and signs involving the circulatory and respiratory systems: Secondary | ICD-10-CM | POA: Insufficient documentation

## 2014-03-03 DIAGNOSIS — R0600 Dyspnea, unspecified: Secondary | ICD-10-CM

## 2014-03-03 DIAGNOSIS — R0602 Shortness of breath: Secondary | ICD-10-CM | POA: Insufficient documentation

## 2014-03-03 DIAGNOSIS — R011 Cardiac murmur, unspecified: Secondary | ICD-10-CM | POA: Insufficient documentation

## 2014-03-03 DIAGNOSIS — I517 Cardiomegaly: Secondary | ICD-10-CM

## 2014-03-03 NOTE — Progress Notes (Signed)
2D Echocardiogram Complete.  03/03/2014   Kekoa Fyock, RDCS

## 2014-03-12 ENCOUNTER — Other Ambulatory Visit: Payer: Self-pay | Admitting: *Deleted

## 2014-03-12 DIAGNOSIS — R0602 Shortness of breath: Secondary | ICD-10-CM

## 2014-03-12 NOTE — Progress Notes (Signed)
Pt. Called and message left to call me  For results and lexiscan

## 2014-03-13 ENCOUNTER — Telehealth (HOSPITAL_COMMUNITY): Payer: Self-pay | Admitting: *Deleted

## 2014-03-14 ENCOUNTER — Other Ambulatory Visit: Payer: Self-pay | Admitting: *Deleted

## 2014-03-14 DIAGNOSIS — R0600 Dyspnea, unspecified: Secondary | ICD-10-CM

## 2014-04-01 ENCOUNTER — Encounter (HOSPITAL_COMMUNITY): Payer: Medicare Other

## 2014-04-03 ENCOUNTER — Telehealth (HOSPITAL_COMMUNITY): Payer: Self-pay

## 2014-04-03 NOTE — Telephone Encounter (Signed)
Encounter complete. 

## 2014-04-04 ENCOUNTER — Encounter (HOSPITAL_COMMUNITY): Payer: Medicare Other

## 2014-04-08 ENCOUNTER — Ambulatory Visit (HOSPITAL_COMMUNITY)
Admission: RE | Admit: 2014-04-08 | Discharge: 2014-04-08 | Disposition: A | Discharge status: Home / Self Care | Payer: Medicare Other | Source: Ambulatory Visit | Attending: Cardiology | Admitting: Cardiology

## 2014-04-08 ENCOUNTER — Encounter (HOSPITAL_COMMUNITY): Payer: Self-pay | Admitting: *Deleted

## 2014-04-08 DIAGNOSIS — R0600 Dyspnea, unspecified: Secondary | ICD-10-CM

## 2014-04-11 ENCOUNTER — Telehealth (HOSPITAL_COMMUNITY): Payer: Self-pay

## 2014-04-11 NOTE — Telephone Encounter (Signed)
Encounter complete. 

## 2014-04-16 ENCOUNTER — Encounter (HOSPITAL_COMMUNITY): Payer: Medicare Other

## 2014-04-17 ENCOUNTER — Telehealth (HOSPITAL_COMMUNITY): Payer: Self-pay

## 2014-04-17 NOTE — Telephone Encounter (Signed)
Encounter complete. 

## 2014-04-22 ENCOUNTER — Other Ambulatory Visit (HOSPITAL_COMMUNITY): Payer: Self-pay | Admitting: Cardiology

## 2014-04-22 ENCOUNTER — Ambulatory Visit (HOSPITAL_COMMUNITY)
Admission: RE | Admit: 2014-04-22 | Discharge: 2014-04-22 | Disposition: A | Discharge status: Home / Self Care | Payer: Medicare Other | Source: Ambulatory Visit | Attending: Cardiology | Admitting: Cardiology

## 2014-04-22 DIAGNOSIS — R0600 Dyspnea, unspecified: Secondary | ICD-10-CM

## 2014-04-22 DIAGNOSIS — R0609 Other forms of dyspnea: Secondary | ICD-10-CM | POA: Diagnosis present

## 2014-04-22 DIAGNOSIS — R0989 Other specified symptoms and signs involving the circulatory and respiratory systems: Principal | ICD-10-CM | POA: Insufficient documentation

## 2014-04-22 MED ORDER — REGADENOSON 0.4 MG/5ML IV SOLN
0.4000 mg | Freq: Once | INTRAVENOUS | Status: AC
  Administered 2014-04-22: 0.4 mg via INTRAVENOUS

## 2014-04-22 MED ORDER — AMINOPHYLLINE 25 MG/ML IV SOLN
75.0000 mg | Freq: Once | INTRAVENOUS | Status: AC
  Administered 2014-04-22: 75 mg via INTRAVENOUS

## 2014-04-22 MED ORDER — TECHNETIUM TC 99M SESTAMIBI GENERIC - CARDIOLITE
10.9000 | Freq: Once | INTRAVENOUS | Status: AC | PRN
  Administered 2014-04-22: 10.9 via INTRAVENOUS

## 2014-04-22 MED ORDER — TECHNETIUM TC 99M SESTAMIBI GENERIC - CARDIOLITE
30.1000 | Freq: Once | INTRAVENOUS | Status: AC | PRN
  Administered 2014-04-22: 30.1 via INTRAVENOUS

## 2014-04-22 NOTE — Procedures (Addendum)
[place] [address] [place] [address] [phone]  Cardiology Nuclear Med Study  Mackenzie Jensen is a 80 y.o. female     MRN : [id number]     DOB: [birth year]  Procedure Date: 04/22/2014  Nuclear Med Background Indication for Stress Test:  Evaluation for Ischemia History:  murmur;Pt reports valve diseaseor damage s/p medication use?;Last NUC MPI on 12/2009-nonischemic;EF=71%;ECHO on 12/10/2009-LVEF=55-60% Cardiac Risk Factors: Family History - CAD, History of Smoking and Obesity  Symptoms:  Chest Pain, Dizziness, DOE, Light-Headedness, Palpitations and SOB   Nuclear Pre-Procedure Caffeine/Decaff Intake:  1:00am NPO After: 11am   IV Site: R Forearm  IV 0.9% NS with Angio Cath:  22g  Chest Size (in):  n/a IV Started by: Rolene Course, RN  Height: 5\' 6"  (1.676 m)  Cup Size: C  BMI:  Body mass index is 32.62 kg/(m^2). Weight:  202 lb (91.627 kg)   Tech Comments:  n/a    Nuclear Med Study 1 or 2 day study: 1 day  Stress Test Type:  [place] Provider:  Minus Breeding, MD   Resting Radionuclide: Technetium 48m Sestamibi  Resting Radionuclide Dose: 10.9 mCi   Stress Radionuclide:  Technetium 85m Sestamibi  Stress Radionuclide Dose: 30.1 mCi           Stress Protocol Rest HR: 67 Stress HR: 90  Rest BP: 148/87 Stress BP: 167/75  Exercise Time (min): n/a METS: n/a          Dose of Adenosine (mg):  n/a Dose of Lexiscan: 0.4 mg  Dose of Atropine (mg): n/a Dose of Dobutamine: n/a mcg/kg/min (at max HR)  Stress Test Technologist: Mellody Memos, CCT Nuclear Technologist: Imagene Riches, CNMT   Rest Procedure:  Myocardial perfusion imaging was performed at rest 45 minutes following the intravenous administration of Technetium 100m Sestamibi. Stress Procedure:  The patient received IV Lexiscan 0.4 mg over 15-seconds.  Technetium 65m Sestamibi injected at 30-seconds.  Patient experienced shortness of breath  and was administered 75 mg of Aminophylline IV at 5 minutes. There were no significant changes with Lexiscan.  Quantitative spect images were obtained after a 45 minute delay.  Transient Ischemic Dilatation (Normal <1.22):  0.82  QGS EDV:  71 ml QGS ESV:  31 ml LV Ejection Fraction: 56%        Rest ECG: NSR - Normal EKG  Stress ECG: No significant ST segment change suggestive of ischemia.  QPS Raw Data Images:  There is interference from nuclear activity from structures below the diaphragm. This does not affect the ability to read the study. Stress Images:  Normal homogeneous uptake in all areas of the myocardium. Rest Images:  Normal homogeneous uptake in all areas of the myocardium. Subtraction (SDS):  No evidence of ischemia.  Impression Exercise Capacity:  Lexiscan with no exercise. BP Response:  Normal blood pressure response. Clinical Symptoms:  There is dyspnea. ECG Impression:  No significant ST segment change suggestive of ischemia. Comparison with Prior Nuclear Study: No images to compare  Overall Impression:  Normal stress nuclear study.  LV Wall Motion:  NL LV Function; NL Wall Motion   [name], MD  04/22/2014 5:09 PM

## 2014-04-25 ENCOUNTER — Telehealth: Payer: Self-pay | Admitting: Cardiology

## 2014-04-25 NOTE — Telephone Encounter (Signed)
Patient notified of results.

## 2014-04-25 NOTE — Telephone Encounter (Signed)
Returning your call from yesterday,think it was about her Stress Test results

## 2014-06-16 ENCOUNTER — Encounter: Payer: Self-pay | Admitting: Internal Medicine

## 2014-06-16 ENCOUNTER — Ambulatory Visit (INDEPENDENT_AMBULATORY_CARE_PROVIDER_SITE_OTHER): Payer: Medicare Other | Admitting: Internal Medicine

## 2014-06-16 VITALS — BP 122/88 | HR 65 | Temp 98.2°F | Ht 66.0 in | Wt 190.5 lb

## 2014-06-16 DIAGNOSIS — K219 Gastro-esophageal reflux disease without esophagitis: Secondary | ICD-10-CM

## 2014-06-16 DIAGNOSIS — E663 Overweight: Secondary | ICD-10-CM

## 2014-06-16 DIAGNOSIS — F411 Generalized anxiety disorder: Secondary | ICD-10-CM

## 2014-06-16 NOTE — Patient Instructions (Signed)
It was good to see you today.  We have reviewed your prior records including labs and tests today  we will send to your prior provider(s) for "release of records" as discussed today. -  okay to follow there as planned and see [place] here once annually to review  Medications reviewed and updated, no changes recommended at this time.  Continue working with your other specialists as reviewed today  Please schedule followup in 12 months, call sooner if problems.

## 2014-06-16 NOTE — Assessment & Plan Note (Signed)
Began generic Lexapro 01/2014 - reports daily anxiety symptoms have improved, rare use of alprazolam, but persisting fatigue Concerned about associated weight loss related to same, but also endorses intentional lifestyle changes including decrease snacking, healthier food choices, and reduced portion intake Patient reports malignancy screening is up-to-date, will send to PCP for verification of same Also reports recent labs negative for metabolic problem, review with release of information has here Given patient BMI still remains greater than 30, no medication changes recommended at this time, but consider change to alternate SSRI if persisting weight loss without other explanation

## 2014-06-16 NOTE — Progress Notes (Signed)
Pre visit review using our clinic review tool, if applicable. No additional management support is needed unless otherwise documented below in the visit note. 

## 2014-06-16 NOTE — Assessment & Plan Note (Signed)
Symptoms controlled with daily Nexium and improvement in dietary choices

## 2014-06-16 NOTE — Progress Notes (Signed)
Subjective:    Patient ID: Mackenzie Jensen, female    DOB: 12-Nov-1933, 80 y.o.   MRN: [id number]  HPI  The patient to me, here to establish a primary care physician in [place] to be related to [facility] network Reviewed chronic medical issues and interval medical events:  Anxiety. Long history of same, exacerbated by medical illnesses following diagnosis of vulvar cancer and glaucoma issues in past several years. Began generic Lexapro June 2015 and symptomatically feels improved, but concerned about associated weight loss with same. Reports daughter also lost weight on this medication. Rare use of alprazolam, chronic insomnia  GERD. Exacerbated by poor food choices and lying supine within an hour after meal. Has pursued lifestyle changes with improvement. Takes Nexium once daily and also reports improvement -endoscopy including EGD and colonoscopy in past several years reportedly negative  Vulvar cancer. Follows at [facility] gynecology for same.  Glaucoma. Diagnosed during intervention for cataracts. Follows at [facility] for same, improved with recent addition of corrective lenses  Past Medical History  Diagnosis Date  . GERD (gastroesophageal reflux disease)   . Hiatal hernia   . Anxiety   . Insomnia   . Osteopenia   . Vulvar cancer     Review of Systems  Constitutional: Positive for fatigue. Negative for fever and unexpected weight change.  Respiratory: Negative for cough and shortness of breath.   Cardiovascular: Negative for chest pain and leg swelling.  Psychiatric/Behavioral: Positive for sleep disturbance (chronic). Negative for dysphoric mood. The patient is nervous/anxious (improved since starting Lexapro 01/2014).        Objective:   Physical Exam  BP 122/88  Pulse 65  Temp(Src) 98.2 F (36.8 C) (Oral)  Ht 5\' 6"  (1.676 m)  Wt 190 lb 8 oz (86.41 kg)  BMI 30.76 kg/m2  SpO2 97% Wt Readings from Last 3 Encounters:  06/16/14 190 lb 8 oz (86.41 kg)  04/22/14 202 lb  (91.627 kg)  02/20/14 202 lb (91.627 kg)   Constitutional: She appears well-developed and well-nourished. No distress.  Neck: Normal range of motion. Neck supple. No JVD present. No thyromegaly present.  Cardiovascular: Normal rate, regular rhythm and normal heart sounds.  No murmur heard. No BLE edema. Pulmonary/Chest: Effort normal and breath sounds normal. No respiratory distress. She has no wheezes.  Psychiatric: She has a normal mood and affect. Her behavior is normal. Judgment and thought content normal.   Lab Results  Component Value Date   WBC 6.3 02/23/2010   HGB 14.2 02/23/2010   HCT 40.8 02/23/2010   PLT 243.0 02/23/2010   GLUCOSE 104* 02/23/2010   CHOL 243* 12/29/2009   TRIG 136.0 12/29/2009   HDL 72.90 12/29/2009   LDLDIRECT 133.4 12/29/2009   NA 142 02/23/2010   K 4.7 02/23/2010   CL 104 02/23/2010   CREATININE 0.7 02/23/2010   BUN 13 02/23/2010   CO2 32 02/23/2010   TSH 1.33 02/23/2010    No results found.     Assessment & Plan:   Problem List Items Addressed This Visit   Generalized anxiety disorder     Began generic Lexapro 01/2014 - reports daily anxiety symptoms have improved, rare use of alprazolam, but persisting fatigue Concerned about associated weight loss related to same, but also endorses intentional lifestyle changes including decrease snacking, healthier food choices, and reduced portion intake Patient reports malignancy screening is up-to-date, will send to PCP for verification of same Also reports recent labs negative for metabolic problem, review with release of information  has here Given patient BMI still remains greater than 30, no medication changes recommended at this time, but consider change to alternate SSRI if persisting weight loss without other explanation    GERD     Symptoms controlled with daily Nexium and improvement in dietary choices    Overweight - Primary     Discussed weight trends, potentially related to initiation of generic Lexapro, but  also overlapping with intentional dietary and lifestyle changes which should result in weight loss. As patient's BMI is now near 30 and further reduction is desired, no change in medications recommended. Patient reports recent laboratory work up with PCP in you to fill unremarkable for metabolic cause contributing to weight loss. Likewise patient's malignancy screenings are up-to-date and normal by report. Review same with information from PCP requested today.  Please note for clarification patient continues to follow with Dr. Velta Addison as PCP in [place], but would like "backup" PCP in [facility] in case hospitalization at [facility] is needed in future -followup in one year, sooner if needed  Time spent with pt/family today 40 minutes, greater than 50% time spent counseling patient on anxiety, eye issues, GERD and cancer hx and medication review. Also review of prior records + ROI

## 2014-10-03 ENCOUNTER — Other Ambulatory Visit: Payer: Self-pay | Admitting: Emergency Medicine

## 2015-09-17 DIAGNOSIS — L851 Acquired keratosis [keratoderma] palmaris et plantaris: Secondary | ICD-10-CM | POA: Diagnosis not present

## 2015-09-17 DIAGNOSIS — M2040 Other hammer toe(s) (acquired), unspecified foot: Secondary | ICD-10-CM | POA: Diagnosis not present

## 2015-09-17 DIAGNOSIS — M79671 Pain in right foot: Secondary | ICD-10-CM | POA: Diagnosis not present

## 2015-10-22 DIAGNOSIS — H401131 Primary open-angle glaucoma, bilateral, mild stage: Secondary | ICD-10-CM | POA: Diagnosis not present

## 2015-11-18 DIAGNOSIS — Z79899 Other long term (current) drug therapy: Secondary | ICD-10-CM | POA: Diagnosis not present

## 2015-11-18 DIAGNOSIS — E559 Vitamin D deficiency, unspecified: Secondary | ICD-10-CM | POA: Diagnosis not present

## 2015-11-18 DIAGNOSIS — E782 Mixed hyperlipidemia: Secondary | ICD-10-CM | POA: Diagnosis not present

## 2015-11-18 DIAGNOSIS — D509 Iron deficiency anemia, unspecified: Secondary | ICD-10-CM | POA: Diagnosis not present

## 2015-11-20 DIAGNOSIS — E559 Vitamin D deficiency, unspecified: Secondary | ICD-10-CM | POA: Diagnosis not present

## 2015-11-20 DIAGNOSIS — F411 Generalized anxiety disorder: Secondary | ICD-10-CM | POA: Diagnosis not present

## 2015-11-20 DIAGNOSIS — K219 Gastro-esophageal reflux disease without esophagitis: Secondary | ICD-10-CM | POA: Diagnosis not present

## 2015-11-20 DIAGNOSIS — Z79899 Other long term (current) drug therapy: Secondary | ICD-10-CM | POA: Diagnosis not present

## 2015-11-20 DIAGNOSIS — R5383 Other fatigue: Secondary | ICD-10-CM | POA: Diagnosis not present

## 2015-11-20 DIAGNOSIS — D509 Iron deficiency anemia, unspecified: Secondary | ICD-10-CM | POA: Diagnosis not present

## 2015-11-20 DIAGNOSIS — E782 Mixed hyperlipidemia: Secondary | ICD-10-CM | POA: Diagnosis not present

## 2015-11-20 DIAGNOSIS — G4700 Insomnia, unspecified: Secondary | ICD-10-CM | POA: Diagnosis not present

## 2015-12-02 DIAGNOSIS — B009 Herpesviral infection, unspecified: Secondary | ICD-10-CM | POA: Diagnosis not present

## 2015-12-02 DIAGNOSIS — L570 Actinic keratosis: Secondary | ICD-10-CM | POA: Diagnosis not present

## 2015-12-02 DIAGNOSIS — Z85828 Personal history of other malignant neoplasm of skin: Secondary | ICD-10-CM | POA: Diagnosis not present

## 2015-12-02 DIAGNOSIS — L820 Inflamed seborrheic keratosis: Secondary | ICD-10-CM | POA: Diagnosis not present

## 2015-12-02 DIAGNOSIS — L814 Other melanin hyperpigmentation: Secondary | ICD-10-CM | POA: Diagnosis not present

## 2015-12-02 DIAGNOSIS — D229 Melanocytic nevi, unspecified: Secondary | ICD-10-CM | POA: Diagnosis not present

## 2015-12-10 DIAGNOSIS — M79671 Pain in right foot: Secondary | ICD-10-CM | POA: Diagnosis not present

## 2015-12-10 DIAGNOSIS — M2040 Other hammer toe(s) (acquired), unspecified foot: Secondary | ICD-10-CM | POA: Diagnosis not present

## 2016-01-11 DIAGNOSIS — H401131 Primary open-angle glaucoma, bilateral, mild stage: Secondary | ICD-10-CM | POA: Diagnosis not present

## 2016-03-03 DIAGNOSIS — Z139 Encounter for screening, unspecified: Secondary | ICD-10-CM | POA: Diagnosis not present

## 2016-03-03 DIAGNOSIS — H6122 Impacted cerumen, left ear: Secondary | ICD-10-CM | POA: Diagnosis not present

## 2016-03-24 DIAGNOSIS — M79671 Pain in right foot: Secondary | ICD-10-CM | POA: Diagnosis not present

## 2016-03-24 DIAGNOSIS — M2040 Other hammer toe(s) (acquired), unspecified foot: Secondary | ICD-10-CM | POA: Diagnosis not present

## 2016-04-28 DIAGNOSIS — H401131 Primary open-angle glaucoma, bilateral, mild stage: Secondary | ICD-10-CM | POA: Diagnosis not present

## 2016-06-01 DIAGNOSIS — E782 Mixed hyperlipidemia: Secondary | ICD-10-CM | POA: Diagnosis not present

## 2016-06-01 DIAGNOSIS — D509 Iron deficiency anemia, unspecified: Secondary | ICD-10-CM | POA: Diagnosis not present

## 2016-06-01 DIAGNOSIS — E559 Vitamin D deficiency, unspecified: Secondary | ICD-10-CM | POA: Diagnosis not present

## 2016-06-01 DIAGNOSIS — Z79899 Other long term (current) drug therapy: Secondary | ICD-10-CM | POA: Diagnosis not present

## 2016-06-01 DIAGNOSIS — R5383 Other fatigue: Secondary | ICD-10-CM | POA: Diagnosis not present

## 2016-06-01 LAB — CBC AND DIFFERENTIAL
HEMATOCRIT: 43 % (ref 36–46)
HEMOGLOBIN: 14.7 g/dL (ref 12.0–16.0)
Platelets: 262 10*3/uL (ref 150–399)
WBC: 6.3 10^3/mL

## 2016-06-01 LAB — LIPID PANEL
Cholesterol: 221 mg/dL — AB (ref 0–200)
HDL: 80 mg/dL — AB (ref 35–70)
LDL Cholesterol: 116 mg/dL
Triglycerides: 123 mg/dL (ref 40–160)

## 2016-06-01 LAB — HEPATIC FUNCTION PANEL
ALT: 15 U/L (ref 7–35)
AST: 10 U/L — AB (ref 13–35)
Alkaline Phosphatase: 55 U/L (ref 25–125)
Bilirubin, Total: 0.3 mg/dL

## 2016-06-01 LAB — TSH: TSH: 2.04 u[IU]/mL (ref 0.41–5.90)

## 2016-06-01 LAB — BASIC METABOLIC PANEL
BUN: 13 mg/dL (ref 4–21)
Creatinine: 0.8 mg/dL (ref 0.5–1.1)
Glucose: 106 mg/dL
POTASSIUM: 4.3 mmol/L (ref 3.4–5.3)
SODIUM: 141 mmol/L (ref 137–147)

## 2016-06-02 DIAGNOSIS — M2040 Other hammer toe(s) (acquired), unspecified foot: Secondary | ICD-10-CM | POA: Diagnosis not present

## 2016-06-02 DIAGNOSIS — M79671 Pain in right foot: Secondary | ICD-10-CM | POA: Diagnosis not present

## 2016-06-13 ENCOUNTER — Ambulatory Visit (INDEPENDENT_AMBULATORY_CARE_PROVIDER_SITE_OTHER): Payer: Medicare Other | Admitting: Family Medicine

## 2016-06-13 ENCOUNTER — Encounter: Payer: Self-pay | Admitting: Family Medicine

## 2016-06-13 ENCOUNTER — Ambulatory Visit (INDEPENDENT_AMBULATORY_CARE_PROVIDER_SITE_OTHER)
Admission: RE | Admit: 2016-06-13 | Discharge: 2016-06-13 | Disposition: A | Discharge status: Home / Self Care | Payer: Medicare Other | Source: Ambulatory Visit | Attending: Family Medicine | Admitting: Family Medicine

## 2016-06-13 VITALS — BP 134/82 | HR 97 | Temp 98.2°F | Resp 12 | Ht 66.0 in | Wt 210.0 lb

## 2016-06-13 DIAGNOSIS — R0609 Other forms of dyspnea: Secondary | ICD-10-CM | POA: Diagnosis not present

## 2016-06-13 DIAGNOSIS — F411 Generalized anxiety disorder: Secondary | ICD-10-CM | POA: Diagnosis not present

## 2016-06-13 DIAGNOSIS — R0600 Dyspnea, unspecified: Secondary | ICD-10-CM | POA: Diagnosis not present

## 2016-06-13 DIAGNOSIS — K219 Gastro-esophageal reflux disease without esophagitis: Secondary | ICD-10-CM | POA: Diagnosis not present

## 2016-06-13 DIAGNOSIS — J309 Allergic rhinitis, unspecified: Secondary | ICD-10-CM

## 2016-06-13 LAB — BRAIN NATRIURETIC PEPTIDE: Pro B Natriuretic peptide (BNP): 69 pg/mL (ref 0.0–100.0)

## 2016-06-13 MED ORDER — ESOMEPRAZOLE MAGNESIUM 40 MG PO CPDR: 40.0000 mg | DELAYED_RELEASE_CAPSULE | Freq: Two times a day (BID) | ORAL | 1 refills | Status: DC

## 2016-06-13 NOTE — Patient Instructions (Addendum)
A few things to remember from today's visit:   Exertional dyspnea - Plan: DG Chest 2 View, B Nat Peptide  Gastroesophageal reflux disease, esophagitis presence not specified  Generalized anxiety disorder  Allergic rhinitis, unspecified chronicity, unspecified seasonality, unspecified trigger    Avoid foods that make your symptoms worse, for example coffee, chocolate,pepermeint,alcohol, and greasy food. Raising the head of your bed about 6 inches may help with nocturnal symptoms.   Weight loss (if you are overweight). Avoid lying down for 3 hours after eating.  Instead 3 large meals daily try small and more frequent meals during the day.  Some medications we recommend for acid reflux treatment (proton pump inhibitors) can cause some problems in the long term: increase risk of osteoporosis, vitamin deficiencies,pneumonia, and more recently discovered that it can increase the risk of chronic kidney disease but seems like in your case benefits are greater that risk.    You should be evaluated immediately if bloody vomiting, bloody stools, black stools (like tar), difficulty swallowing, food gets stuck on the way down or choking when eating. Abnormal weight loss or severe abdominal pain.  For allergic rhinitis, Allegra 180 mg daily, nasal irrigations and steam inhalations.  Please be sure medication list is accurate. If a new problem present, please set up appointment sooner than planned today.

## 2016-06-13 NOTE — Progress Notes (Signed)
HPI:   [name] is a 82 y.o. female, who is here today to establish care with me.  Former PCP: Dr Mackenzie Jensen Last preventive routine visit: Recently, labs done and appt tomorrow to go through results.   Concerns today: SOB  Today she is concerned about exertional dyspnea, which she reports as having for "a while" and she is attributing it to eye drops prescribed for glaucoma, Rx has been changed 4 days ago (according to pt). She is not sure about distance she can walk before she feels dyspnea, going up stairs also exacerbate symptoms. She denies associated chest pain, diaphoresis, or dizziness.   + Intermittent wheezing and cough, also exacerbated by exertion. + Nasal congestion.  She feels like problem is getting worse.  Cardiology evaluation has been done in the past due to dyspnea. Myocardiac perfusion test 03/2014: No evidence of ischemia or infarct.  Work up negative.   Echo 02/2014: LVEF 55-60%. Wall motion was normal; there were no regional wall motionabnormalities.   GERD: "Bad"  Symptoms exacerbated by "red food." + Acid reflux she feels up in her throat, burning. Maalox and on Nexium 40 mg daily, which has helped.  Denies abdominal pain, vomiting, changes in bowel habits, blood in stool or melena. She felt nauseated about 2 weeks ago, lasted a day.    Former smoker. Allergic rhinitis, she is having rhinorrhea and nasal congestion. She denies fever or chills.   She does not exercise regularly because hx of knee OA, R>L.  Takes OTC analgesics.  Hx of anxiety, she is on Lexapro 10 mg. She denies suicidal thoughts or depressed mood.  Hx of anemia, she is on Fe Sulfate, according to pt, she had labs done recently to follow on this problem as well.    Review of Systems  Constitutional: Negative for activity change, appetite change, fatigue, fever and unexpected weight change.  HENT: Positive for congestion and postnasal drip. Negative for ear pain,  facial swelling, mouth sores, sinus pressure, sneezing, sore throat, trouble swallowing and voice change.   Eyes: Positive for discharge and itching. Negative for redness and visual disturbance.  Respiratory: Positive for cough, shortness of breath and wheezing. Negative for stridor.   Cardiovascular: Negative for chest pain, palpitations and leg swelling.  Gastrointestinal: Negative for abdominal pain, nausea and vomiting.       Denies changes in bowel habits.  Musculoskeletal: Positive for arthralgias (Knees). Negative for back pain, myalgias and neck pain.  Skin: Negative for pallor and rash.  Allergic/Immunologic: Positive for environmental allergies.  Neurological: Negative for syncope, weakness, numbness and headaches.  Hematological: Negative for adenopathy. Does not bruise/bleed easily.  Psychiatric/Behavioral: Positive for sleep disturbance. Negative for confusion. The patient is nervous/anxious.       Current Outpatient Prescriptions on File Prior to Visit  Medication Sig Dispense Refill  . acyclovir (ZOVIRAX) 200 MG capsule     . dorzolamide (TRUSOPT) 2 % ophthalmic solution     . escitalopram (LEXAPRO) 10 MG tablet     . ferrous sulfate 325 (65 FE) MG tablet Take 325 mg by mouth daily with breakfast.     No current facility-administered medications on file prior to visit.      Past Medical History:  Diagnosis Date  . Anxiety   . GERD (gastroesophageal reflux disease)   . Hiatal hernia   . Insomnia   . Osteopenia   . Right knee DJD   . Vulvar cancer ([place])    Allergies  Allergen Reactions  . Procaine Hcl     Family History  Problem Relation Age of Onset  . CAD Father 88  . CAD Brother 47    Social History   Social History  . Marital status: Married    Spouse name: N/A  . Number of children: 2  . Years of education: N/A   Social History Main Topics  . Smoking status: Former Smoker    Packs/day: 1.00    Years: 30.00    Types: Cigarettes  .  Smokeless tobacco: None     Comment: Quit 30 years ago  . Alcohol use None  . Drug use: Unknown  . Sexual activity: Not Asked   Other Topics Concern  . None   Social History Narrative   Lives with husband.     Vitals:   06/13/16 1351  BP: 134/82  Pulse: 97  Resp: 12  Temp: 98.2 F (36.8 C)   O2 sat 98% at RA.  Body mass index is 33.89 kg/m.    Physical Exam  Nursing note and vitals reviewed. Constitutional: She is oriented to person, place, and time. She appears well-developed. No distress.  HENT:  Head: Atraumatic.  Nose: Rhinorrhea and septal deviation present.  Mouth/Throat: Oropharynx is clear and moist and mucous membranes are normal.  Nasal voice, post nasal drainage, epiphora.  Eyes: Conjunctivae and EOM are normal. Pupils are equal, round, and reactive to light.  Neck: No JVD present. No tracheal deviation present. No thyroid mass and no thyromegaly present.  Cardiovascular: Normal rate and regular rhythm.   Murmur (SEM I/VI RUSB) heard. Pulses:      Dorsalis pedis pulses are 2+ on the right side, and 2+ on the left side.  Respiratory: Effort normal and breath sounds normal. No respiratory distress.  GI: Soft. She exhibits no mass. There is no hepatomegaly. There is no tenderness.  Musculoskeletal: She exhibits no edema or tenderness.  Neurological: She is alert and oriented to person, place, and time. She has normal strength. Coordination normal.  Skin: Skin is warm. No erythema.  Psychiatric: Her speech is normal. Her mood appears anxious.  Well groomed, good eye contact.      ASSESSMENT AND PLAN:     [name] was seen today for establish care.  Diagnoses and all orders for this visit:    Exertional dyspnea  We discussed possible etiologies: pulmonary (asthma/COPD), cardiac, allergies, obesity/deconditioning  among some. Clearly instructed about warning signs. According to pt, she just had lab work recently, she will bring copy of results to  her f/u appt, 3 weeks.  -     DG Chest 2 View; Future -     B Nat Peptide  Gastroesophageal reflux disease, esophagitis presence not specified  Not well controlled. This problem could also aggravate some of her symptoms and even cause perception of SOB. She will increase Nexium from once daily to bid. Some side effects of PPI discussed, planning on decreasing dose in 3-4 weeks. GERD precautions discussed. Wt loss strongly recommended. F/U in 3 weeks.  -     esomeprazole (NEXIUM) 40 MG capsule; Take 1 capsule (40 mg total) by mouth 2 (two) times daily before a meal.  Generalized anxiety disorder  Stable overall but does not seem well controlled. No changes in current management for now.  Allergic rhinitis, unspecified chronicity, unspecified seasonality, unspecified trigger  OTC Allegra 180 mg daily and nasal irrigations with saline. Steam inhalations may also help. Because Hx of glaucoma I am not  recommending intranasal steroids.         Betty G. Martinique, MD  [facility]. [place] office.

## 2016-06-14 DIAGNOSIS — R5383 Other fatigue: Secondary | ICD-10-CM | POA: Diagnosis not present

## 2016-06-14 DIAGNOSIS — G4700 Insomnia, unspecified: Secondary | ICD-10-CM | POA: Diagnosis not present

## 2016-06-14 DIAGNOSIS — Z79899 Other long term (current) drug therapy: Secondary | ICD-10-CM | POA: Diagnosis not present

## 2016-06-14 DIAGNOSIS — E782 Mixed hyperlipidemia: Secondary | ICD-10-CM | POA: Diagnosis not present

## 2016-06-14 DIAGNOSIS — Z0000 Encounter for general adult medical examination without abnormal findings: Secondary | ICD-10-CM | POA: Diagnosis not present

## 2016-06-14 DIAGNOSIS — F411 Generalized anxiety disorder: Secondary | ICD-10-CM | POA: Diagnosis not present

## 2016-06-14 DIAGNOSIS — R0602 Shortness of breath: Secondary | ICD-10-CM | POA: Diagnosis not present

## 2016-06-14 DIAGNOSIS — E559 Vitamin D deficiency, unspecified: Secondary | ICD-10-CM | POA: Diagnosis not present

## 2016-06-14 DIAGNOSIS — K219 Gastro-esophageal reflux disease without esophagitis: Secondary | ICD-10-CM | POA: Diagnosis not present

## 2016-06-14 DIAGNOSIS — D509 Iron deficiency anemia, unspecified: Secondary | ICD-10-CM | POA: Diagnosis not present

## 2016-06-16 DIAGNOSIS — H401131 Primary open-angle glaucoma, bilateral, mild stage: Secondary | ICD-10-CM | POA: Diagnosis not present

## 2016-07-13 ENCOUNTER — Ambulatory Visit (INDEPENDENT_AMBULATORY_CARE_PROVIDER_SITE_OTHER): Payer: Medicare Other | Admitting: Family Medicine

## 2016-07-13 ENCOUNTER — Encounter: Payer: Self-pay | Admitting: Family Medicine

## 2016-07-13 VITALS — BP 140/76 | HR 84 | Temp 97.6°F | Resp 12 | Ht 66.0 in | Wt 212.2 lb

## 2016-07-13 DIAGNOSIS — K219 Gastro-esophageal reflux disease without esophagitis: Secondary | ICD-10-CM | POA: Diagnosis not present

## 2016-07-13 DIAGNOSIS — R0609 Other forms of dyspnea: Secondary | ICD-10-CM | POA: Diagnosis not present

## 2016-07-13 DIAGNOSIS — R0600 Dyspnea, unspecified: Secondary | ICD-10-CM

## 2016-07-13 DIAGNOSIS — J45909 Unspecified asthma, uncomplicated: Secondary | ICD-10-CM | POA: Diagnosis not present

## 2016-07-13 MED ORDER — FLUTICASONE-SALMETEROL 250-50 MCG/DOSE IN AEPB: 1.0000 | INHALATION_SPRAY | Freq: Two times a day (BID) | RESPIRATORY_TRACT | 3 refills | Status: DC

## 2016-07-13 NOTE — Progress Notes (Signed)
HPI:   [name] is a 82 y.o. female, who is here today to follow on last OV, when she was c/o exertional dyspnea.  I saw her on 06/13/16 , when she established care with me. She was c/o "bad" GERD symptoms and dyspnea. Nexium was increased to 40 mg bid, she has noted great improvement in GI symptoms. She is not longer having acid reflux "attacks" or burning in throat. Tolerating medication well, no side effects reported.  She is still having exertional dyspnea, frequently associated with cough and wheezing. + Former smoker. She denies chest pain or diaphoresis.  She wonders if these symptoms are related to mold exposure, recently found in her house. She also thinks symptoms may be related to eye drops, which she is still using, Hx of glaucoma.   She has an appt with cardiologists in 07/2016, she has one already but decided to see her husbands' cardiologists. Today she brought copy of labs she had recently, already discussed with ordering provider.  Last OV, BNP and CXR otherwise normal, except for large hiatal hernia seen on CXR.   Lab Results  Component Value Date   WBC 6.3 06/01/2016   HGB 14.7 06/01/2016   HCT 43 06/01/2016   MCV 93.6 02/23/2010   PLT 262 06/01/2016   Lab Results  Component Value Date   ALT 15 06/01/2016   AST 10 (A) 06/01/2016   ALKPHOS 55 06/01/2016   Lab Results  Component Value Date   CREATININE 0.8 06/01/2016   BUN 13 06/01/2016   NA 141 06/01/2016   K 4.3 06/01/2016   CL 104 02/23/2010   CO2 32 02/23/2010   Lab Results  Component Value Date   CHOL 221 (A) 06/01/2016   HDL 80 (A) 06/01/2016   LDLCALC 116 06/01/2016   LDLDIRECT 133.4 12/29/2009   TRIG 123 06/01/2016   CHOLHDL 3 12/29/2009    She denies hemoptysis or abnormal wt loss. Reviewing records last OV, it seemed like she has had work-up done for dyspnea: Myocardial perfusion and Echo.   She denies new symptoms/concerns.     Review of Systems    Constitutional: Negative for activity change, appetite change, diaphoresis, fatigue, fever and unexpected weight change.  HENT: Negative for mouth sores, nosebleeds, sore throat and trouble swallowing.   Respiratory: Positive for cough, shortness of breath and wheezing.   Cardiovascular: Negative for chest pain, palpitations and leg swelling.  Gastrointestinal: Negative for abdominal pain, nausea and vomiting.       Negative for changes in bowel habits.  Genitourinary: Negative for decreased urine volume, difficulty urinating, dysuria and hematuria.  Allergic/Immunologic: Positive for environmental allergies.  Neurological: Negative for syncope, weakness and headaches.  Psychiatric/Behavioral: Negative for confusion. The patient is nervous/anxious.       Current Outpatient Prescriptions on File Prior to Visit  Medication Sig Dispense Refill  . acyclovir (ZOVIRAX) 200 MG capsule     . dorzolamide (TRUSOPT) 2 % ophthalmic solution     . escitalopram (LEXAPRO) 10 MG tablet     . esomeprazole (NEXIUM) 40 MG capsule Take 1 capsule (40 mg total) by mouth 2 (two) times daily before a meal. 60 capsule 1  . ferrous sulfate 325 (65 FE) MG tablet Take 325 mg by mouth daily with breakfast.     No current facility-administered medications on file prior to visit.      Past Medical History:  Diagnosis Date  . Anxiety   . GERD (gastroesophageal reflux  disease)   . Hiatal hernia   . Insomnia   . Osteopenia   . Right knee DJD   . Vulvar cancer ([place])    Allergies  Allergen Reactions  . Procaine Hcl     Social History   Social History  . Marital status: Married    Spouse name: N/A  . Number of children: 2  . Years of education: N/A   Social History Main Topics  . Smoking status: Former Smoker    Packs/day: 1.00    Years: 30.00    Types: Cigarettes  . Smokeless tobacco: None     Comment: Quit 30 years ago  . Alcohol use None  . Drug use: Unknown  . Sexual activity: Not Asked    Other Topics Concern  . None   Social History Narrative   Lives with husband.     Vitals:   07/13/16 1143  BP: 140/76  Pulse: 84  Resp: 12  Temp: 97.6 F (36.4 C)    O2 sat 98% at RA.  Body mass index is 34.25 kg/m.      Physical Exam  Nursing note and vitals reviewed. Constitutional: She is oriented to person, place, and time. She appears well-developed. No distress.  HENT:  Head: Atraumatic.  Mouth/Throat: Oropharynx is clear and moist and mucous membranes are normal.  Eyes: Conjunctivae and EOM are normal.  Neck: No JVD present. No thyroid mass present.  Cardiovascular: Normal rate and regular rhythm.   Murmur (RUSB I/VI SEM) heard. Pulses:      Dorsalis pedis pulses are 2+ on the right side, and 2+ on the left side.  Respiratory: Effort normal and breath sounds normal. No respiratory distress.  GI: Soft. She exhibits no mass. There is no hepatomegaly. There is no tenderness.  Musculoskeletal: She exhibits no edema.  Neurological: She is alert and oriented to person, place, and time. She has normal strength. Coordination normal.  Skin: Skin is warm. No erythema.  Psychiatric: Her mood appears anxious.  Well groomed, good eye contact.      ASSESSMENT AND PLAN:     Mackenzie Jensen was seen today for follow-up.  Diagnoses and all orders for this visit:  Gastroesophageal reflux disease, esophagitis presence not specified  Improved. For now no changes in Nexium, we discussed some side effects of PPI's but at this time benefit seems to be grater than risk. GERD precautions discussed. Wt loss may also help. F/U in 2 months, will consider decreasing night dose.  Reactive airway disease with wheezing without complication, unspecified asthma severity, unspecified whether persistent  ? COPD Vs Asthma. She agrees with trying Advair 250-50 mcg bid. Instructed about warning signs.  F/U in 2 months.   -     Fluticasone-Salmeterol (ADVAIR DISKUS) 250-50 MCG/DOSE  AEPB; Inhale 1 puff into the lungs 2 (two) times daily.  Exertional dyspnea   We discussed possible causes including pulmonary,cardiac, and deconditioning.  PFT's 01/2014 because dyspnea: Spirometry with flow volume loops demonstrates mild airway obstruction. Following the inhalation of a bronchodilator, there is no significant change in airway obstruction. This does not necessarily mean that chronic bronchodilator therapy may not be useful. Lung volumes are normal. Thediffusing capacity for carbon monoxide, a reflection of alveolar-capillary gastransport,is normal.  She is keeping appt with cardiologists, referral placed. Clearly instructed about warning signs.  May consider chest CT if not improvement and after cardiac evaluation.  -     Ambulatory referral to Cardiology      -Ms. Mackenzie Jensen  was advised to return sooner than planned today if new concerns arise.       Betty G. Martinique, MD  [facility]. [place] office.

## 2016-07-13 NOTE — Patient Instructions (Addendum)
A few things to remember from today's visit:   Gastroesophageal reflux disease, esophagitis presence not specified  Reactive airway disease with wheezing without complication, unspecified asthma severity, unspecified whether persistent - Plan: Fluticasone-Salmeterol (ADVAIR DISKUS) 250-50 MCG/DOSE AEPB  Exertional dyspnea - Plan: Ambulatory referral to Cardiology    Avoid foods that make your symptoms worse, for example coffee, chocolate,pepermeint,alcohol, and greasy food. Raising the head of your bed about 6 inches may help with nocturnal symptoms.   Weight loss (if you are overweight). Avoid lying down for 3 hours after eating.  Instead 3 large meals daily try small and more frequent meals during the day.  Some medications we recommend for acid reflux treatment (proton pump inhibitors) can cause some problems in the long term: increase risk of osteoporosis, vitamin deficiencies,pneumonia, and more recently discovered that it can increase the risk of chronic kidney disease BUT seems like benefit is greater than risk at this time.  You should be evaluated immediately if bloody vomiting, bloody stools, black stools (like tar), difficulty swallowing, food gets stuck on the way down or choking when eating. Abnormal weight loss or severe abdominal pain.    Please be sure medication list is accurate. If a new problem present, please set up appointment sooner than planned today.

## 2016-07-13 NOTE — Progress Notes (Signed)
Pre visit review using our clinic review tool, if applicable. No additional management support is needed unless otherwise documented below in the visit note. 

## 2016-07-28 DIAGNOSIS — M79671 Pain in right foot: Secondary | ICD-10-CM | POA: Diagnosis not present

## 2016-07-28 DIAGNOSIS — M2040 Other hammer toe(s) (acquired), unspecified foot: Secondary | ICD-10-CM | POA: Diagnosis not present

## 2016-08-05 ENCOUNTER — Encounter: Payer: Self-pay | Admitting: Internal Medicine

## 2016-08-11 ENCOUNTER — Ambulatory Visit (INDEPENDENT_AMBULATORY_CARE_PROVIDER_SITE_OTHER): Payer: Medicare Other | Admitting: Internal Medicine

## 2016-08-11 ENCOUNTER — Encounter: Payer: Self-pay | Admitting: Internal Medicine

## 2016-08-11 VITALS — BP 132/80 | HR 79 | Ht 66.5 in | Wt 209.6 lb

## 2016-08-11 DIAGNOSIS — R0602 Shortness of breath: Secondary | ICD-10-CM | POA: Diagnosis not present

## 2016-08-11 DIAGNOSIS — R0683 Snoring: Secondary | ICD-10-CM | POA: Diagnosis not present

## 2016-08-11 DIAGNOSIS — R072 Precordial pain: Secondary | ICD-10-CM | POA: Diagnosis not present

## 2016-08-11 NOTE — Progress Notes (Signed)
Electrophysiology Office Note   Date:  08/11/2016   ID:  ALANIZ SKIDMORE, DOB 01/11/1934, MRN [id number]  PCP:  Betty Martinique, MD  Cardiologist:  Thompson Grayer, MD    CC: SOB   History of Present Illness: Mackenzie Jensen is a 82 y.o. female who presents today for cardiology evaluation.   She has no prior cardiac history.  She has noticed over the past few months progressive SOB with mild to moderate activity.  She has associated chest heaviness.  She has occasional wheezing.  She is not very active due to DJD.  She has GERD which has been severe.  She notices + water brash sensation.  She props her head up at night due to this. Today, she denies symptoms of palpitations, PND, lower extremity edema, claudication, dizziness, presyncope, syncope, bleeding, or neurologic sequela. The patient is tolerating medications without difficulties and is otherwise without complaint today.    Past Medical History:  Diagnosis Date  . Anxiety   . GERD (gastroesophageal reflux disease)   . Hiatal hernia   . Insomnia   . Osteopenia   . Right knee DJD   . Vulvar cancer [facility])    Past Surgical History:  Procedure Laterality Date  . COSMETIC SURGERY    . TONSILLECTOMY AND ADENOIDECTOMY    . TUBAL LIGATION    . VULVA SURGERY       Current Outpatient Prescriptions  Medication Sig Dispense Refill  . acyclovir (ZOVIRAX) 200 MG capsule Take 200-1,000 mg by mouth daily. Take as directed    . BRIMONIDINE TARTRATE OP Place 1 drop into both eyes as directed.    . Cholecalciferol (VITAMIN D PO) Take 1 capsule by mouth daily.    . dorzolamide (TRUSOPT) 2 % ophthalmic solution Place 1 drop into both eyes as directed.     . escitalopram (LEXAPRO) 10 MG tablet Take 10 mg by mouth daily.     Marland Kitchen esomeprazole (NEXIUM) 40 MG capsule Take 1 capsule (40 mg total) by mouth 2 (two) times daily before a meal. 60 capsule 1  . ferrous sulfate 325 (65 FE) MG tablet Take 325 mg by mouth daily with breakfast.    . latanoprost  (XALATAN) 0.005 % ophthalmic solution Place 1 drop into both eyes as directed.    . Misc Natural Products (OSTEO BI-FLEX JOINT SHIELD PO) Take 1 tablet by mouth daily.    . Multiple Vitamin (MULTIVITAMIN) capsule Take 1 capsule by mouth daily.     No current facility-administered medications for this visit.     Allergies:   Dorzolamide hcl-timolol mal pf and Novocain [procaine]   Social History:  The patient  reports that she has quit smoking. Her smoking use included Cigarettes. She has a 30.00 pack-year smoking history. She has never used smokeless tobacco. She reports that she does not drink alcohol or use drugs.   Family History:  The patient's  family history includes CAD (age of onset: 17) in her brother; CAD (age of onset: 11) in her father.    ROS:  Please see the history of present illness.   All other systems are reviewed and negative.    PHYSICAL EXAM: VS:  BP 132/80   Pulse 79   Ht 5' 6.5" (1.689 m)   Wt 209 lb 9.6 oz (95.1 kg)   BMI 33.32 kg/m  , BMI Body mass index is 33.32 kg/m. GEN: overweight, in no acute distress  HEENT: normal  Neck: no JVD, carotid bruits, or masses  Cardiac: RRR; no murmurs, rubs, or gallops,no edema  Respiratory:  clear to auscultation bilaterally, normal work of breathing GI: soft, nontender, nondistended, + BS MS: no deformity or atrophy  Skin: warm and dry  Neuro:  Strength and sensation are intact Psych: euthymic mood, full affect  EKG:  EKG is ordered today. The ekg ordered today shows sinus rhythm 79 bpm, rsr', nonspecific St/T changes   Recent Labs: 06/01/2016: ALT 15; BUN 13; Creatinine 0.8; Hemoglobin 14.7; Platelets 262; Potassium 4.3; Sodium 141; TSH 2.04 06/13/2016: Pro B Natriuretic peptide (BNP) 69.0    Lipid Panel     Component Value Date/Time   CHOL 221 (A) 06/01/2016   TRIG 123 06/01/2016   HDL 80 (A) 06/01/2016   CHOLHDL 3 12/29/2009 0923   VLDL 27.2 12/29/2009 0923   LDLCALC 116 06/01/2016   LDLDIRECT 133.4  12/29/2009 0923     Wt Readings from Last 3 Encounters:  08/11/16 209 lb 9.6 oz (95.1 kg)  07/13/16 212 lb 3.2 oz (96.3 kg)  06/13/16 210 lb (95.3 kg)      Other studies Reviewed: Additional studies/ records that were reviewed today include: records from Dr Martinique, prior echo  Review of the above records today demonstrates: as above   ASSESSMENT AND PLAN:  1.  SOB/ chest discomfort Unclear etiology She is overweight and not very activity.  Though symptoms may be due to deconditioning, I worry about CAD as the cause also. Will order echo to evaluate for structural heart issues and also a myoview to evaluate SOB.  She would like to try exercise treadmill with nuclear imaging rather than lexiscan though given DJD, I would anticipate that she may convert to a lexiscan if unable to achieve adequate heart rate response.  Given abnormal ekg, nuclear imaging is required.  If testing is normal, would consider PFTs.  2. Obesity Body mass index is 33.32 kg/m. Regular exercise and weight reduction encouraged.  3. Snoring I have encouraged sleep study which she declines  Follow-up:  Return to see me in 3 weeks  Current medicines are reviewed at length with the patient today.   The patient does not have concerns regarding her medicines.  The following changes were made today:  none  Labs/ tests ordered today include:  Orders Placed This Encounter  Procedures  . Myocardial Perfusion Imaging  . EKG 12-Lead  . ECHOCARDIOGRAM COMPLETE     Signed, Thompson Grayer, MD  08/11/2016 4:27 PM     [place] [address] [phone] (office) [phone] (fax)

## 2016-08-11 NOTE — Patient Instructions (Signed)
Medication Instructions:  Your physician recommends that you continue on your current medications as directed. Please refer to the Current Medication list given to you today.   Labwork: None ordered   Testing/Procedures: Your physician has requested that you have an exercise tolerance test. For further information please visit [email or web address]. Please also follow instruction sheet, as given.  Your physician has requested that you have an echocardiogram. Echocardiography is a painless test that uses sound waves to create images of your heart. It provides your doctor with information about the size and shape of your heart and how well your heart's chambers and valves are working. This procedure takes approximately one hour. There are no restrictions for this procedure.    Follow-Up: Your physician recommends that you schedule a follow-up appointment in: 3 weeks with Dr Rayann Heman   Any Other Special Instructions Will Be Listed Below (If Applicable).     If you need a refill on your cardiac medications before your next appointment, please call your pharmacy.

## 2016-08-17 DIAGNOSIS — Z1231 Encounter for screening mammogram for malignant neoplasm of breast: Secondary | ICD-10-CM | POA: Diagnosis not present

## 2016-08-17 DIAGNOSIS — Z01419 Encounter for gynecological examination (general) (routine) without abnormal findings: Secondary | ICD-10-CM | POA: Diagnosis not present

## 2016-08-19 ENCOUNTER — Encounter: Payer: Self-pay | Admitting: Internal Medicine

## 2016-08-24 ENCOUNTER — Other Ambulatory Visit: Payer: Self-pay | Admitting: Family Medicine

## 2016-08-24 DIAGNOSIS — K219 Gastro-esophageal reflux disease without esophagitis: Secondary | ICD-10-CM

## 2016-09-01 ENCOUNTER — Telehealth (HOSPITAL_COMMUNITY): Payer: Self-pay | Admitting: *Deleted

## 2016-09-01 NOTE — Telephone Encounter (Signed)
Left message on voicemail per DPR in reference to upcoming appointment scheduled on 09/06/16 at 1000 with detailed instructions given per Myocardial Perfusion Study Information Sheet for the test. LM to arrive 15 minutes early, and that it is imperative to arrive on time for appointment to keep from having the test rescheduled. If you need to cancel or reschedule your appointment, please call the office within 24 hours of your appointment. Failure to do so may result in a cancellation of your appointment, and a $50 no show fee. Phone number given for call back for any questions.

## 2016-09-05 ENCOUNTER — Ambulatory Visit: Payer: Medicare Other | Admitting: Internal Medicine

## 2016-09-06 ENCOUNTER — Encounter (HOSPITAL_COMMUNITY): Payer: Medicare Other

## 2016-09-06 ENCOUNTER — Other Ambulatory Visit (HOSPITAL_COMMUNITY): Payer: Medicare Other

## 2016-09-09 ENCOUNTER — Ambulatory Visit: Payer: Medicare Other | Admitting: Internal Medicine

## 2016-09-12 ENCOUNTER — Ambulatory Visit: Payer: Medicare Other | Admitting: Family Medicine

## 2016-09-20 ENCOUNTER — Ambulatory Visit: Payer: Medicare Other | Admitting: Family Medicine

## 2016-09-28 ENCOUNTER — Other Ambulatory Visit: Payer: Self-pay | Admitting: Family Medicine

## 2016-09-28 DIAGNOSIS — K219 Gastro-esophageal reflux disease without esophagitis: Secondary | ICD-10-CM

## 2016-11-02 ENCOUNTER — Telehealth (HOSPITAL_COMMUNITY): Payer: Self-pay | Admitting: *Deleted

## 2016-11-02 NOTE — Telephone Encounter (Signed)
Left message on voicemail per DPR in reference to upcoming appointment scheduled on 11/03/16 with detailed instructions given per Myocardial Perfusion Study Information Sheet for the test. LM to arrive 15 minutes early, and that it is imperative to arrive on time for appointment to keep from having the test rescheduled. If you need to cancel or reschedule your appointment, please call the office within 24 hours of your appointment. Failure to do so may result in a cancellation of your appointment, and a $50 no show fee. Phone number given for call back for any questions. Kirstie Peri

## 2016-11-03 ENCOUNTER — Telehealth: Payer: Self-pay | Admitting: Internal Medicine

## 2016-11-03 NOTE — Telephone Encounter (Signed)
New message   Pt would like information on whether she should eat/drink anything before her scheduled echo and stress test in the morning. Requests call back

## 2016-11-03 NOTE — Telephone Encounter (Signed)
Spoke with patient and let her know to not eat or drink 2 hours prior to the test.  She said she is extremely nervous about the test.  She was appreciative of my call

## 2016-11-04 ENCOUNTER — Ambulatory Visit (HOSPITAL_COMMUNITY): Payer: Medicare Other | Attending: Internal Medicine

## 2016-11-04 ENCOUNTER — Other Ambulatory Visit: Payer: Self-pay

## 2016-11-04 ENCOUNTER — Ambulatory Visit (HOSPITAL_BASED_OUTPATIENT_CLINIC_OR_DEPARTMENT_OTHER): Payer: Medicare Other

## 2016-11-04 DIAGNOSIS — I080 Rheumatic disorders of both mitral and aortic valves: Secondary | ICD-10-CM | POA: Diagnosis not present

## 2016-11-04 DIAGNOSIS — I700 Atherosclerosis of aorta: Secondary | ICD-10-CM | POA: Diagnosis not present

## 2016-11-04 DIAGNOSIS — R0602 Shortness of breath: Secondary | ICD-10-CM | POA: Insufficient documentation

## 2016-11-04 LAB — MYOCARDIAL PERFUSION IMAGING
CHL CUP NUCLEAR SDS: 2
CHL CUP RESTING HR STRESS: 79 {beats}/min
LV dias vol: 79 mL (ref 46–106)
LVSYSVOL: 26 mL
NUC STRESS TID: 1.13
Peak HR: 97 {beats}/min
RATE: 0.29
SRS: 7
SSS: 9

## 2016-11-04 MED ORDER — TECHNETIUM TC 99M TETROFOSMIN IV KIT
31.8000 | PACK | Freq: Once | INTRAVENOUS | Status: AC | PRN
  Administered 2016-11-04: 31.8 via INTRAVENOUS
  Filled 2016-11-04: qty 32

## 2016-11-04 MED ORDER — REGADENOSON 0.4 MG/5ML IV SOLN
0.4000 mg | Freq: Once | INTRAVENOUS | Status: AC
  Administered 2016-11-04: 0.4 mg via INTRAVENOUS

## 2016-11-04 MED ORDER — TECHNETIUM TC 99M TETROFOSMIN IV KIT
10.5000 | PACK | Freq: Once | INTRAVENOUS | Status: AC | PRN
  Administered 2016-11-04: 10.5 via INTRAVENOUS
  Filled 2016-11-04: qty 11

## 2016-11-21 ENCOUNTER — Ambulatory Visit (INDEPENDENT_AMBULATORY_CARE_PROVIDER_SITE_OTHER): Payer: Medicare Other | Admitting: Internal Medicine

## 2016-11-21 VITALS — BP 138/80 | HR 87 | Ht 67.0 in | Wt 209.4 lb

## 2016-11-21 DIAGNOSIS — R0602 Shortness of breath: Secondary | ICD-10-CM

## 2016-11-21 NOTE — Progress Notes (Signed)
Electrophysiology Office Note   Date:  11/21/2016   ID:  Mackenzie Jensen, DOB 12-29-1933, MRN [id number]  PCP:  Betty Martinique, MD  Cardiologist:  Thompson Grayer, MD    CC: SOB   History of Present Illness: Mackenzie Jensen is a 83 y.o. female who presents today for cardiology evaluation.   She seems to be doing a little better.  She thinks that her eye drops for glaucoma may have been contributing to symptoms.  She is not very active.  + SOB but no further CP.  Today, she denies symptoms of palpitations, PND, lower extremity edema, claudication, dizziness, presyncope, syncope, bleeding, or neurologic sequela. The patient is tolerating medications without difficulties and is otherwise without complaint today.    Past Medical History:  Diagnosis Date  . Anxiety   . GERD (gastroesophageal reflux disease)   . Hiatal hernia   . Insomnia   . Osteopenia   . Right knee DJD   . Vulvar cancer [facility])    Past Surgical History:  Procedure Laterality Date  . COSMETIC SURGERY    . TONSILLECTOMY AND ADENOIDECTOMY    . TUBAL LIGATION    . VULVA SURGERY       Current Outpatient Prescriptions  Medication Sig Dispense Refill  . acyclovir (ZOVIRAX) 200 MG capsule Take 200-1,000 mg by mouth daily. Take as directed    . Cholecalciferol (VITAMIN D PO) Take 1 capsule by mouth daily.    Marland Kitchen escitalopram (LEXAPRO) 10 MG tablet Take 10 mg by mouth daily.     Marland Kitchen esomeprazole (NEXIUM) 40 MG capsule TAKE  (1)  CAPSULE  TWICE DAILY (TAKE ON AN EMPTY STOMACH AT LEAST 30MIN- UTES BEFORE MEALS). 60 capsule 0  . ferrous sulfate 325 (65 FE) MG tablet Take 325 mg by mouth daily with breakfast.    . Misc Natural Products (OSTEO BI-FLEX JOINT SHIELD PO) Take 1 tablet by mouth daily.    . Multiple Vitamin (MULTIVITAMIN) capsule Take 1 capsule by mouth daily.     No current facility-administered medications for this visit.     Allergies:   Dorzolamide hcl-timolol mal pf and Novocain [procaine]   Social History:  The  patient  reports that she has quit smoking. Her smoking use included Cigarettes. She has a 30.00 pack-year smoking history. She has never used smokeless tobacco. She reports that she does not drink alcohol or use drugs.   Family History:  The patient's  family history includes CAD (age of onset: 26) in her brother; CAD (age of onset: 45) in her father.    ROS:  Please see the history of present illness.   All other systems are reviewed and negative.    PHYSICAL EXAM: VS:  BP 138/80   Pulse 87   Ht 5\' 7"  (1.702 m)   Wt 209 lb 6 oz (95 kg)   SpO2 97%   BMI 32.79 kg/m  , BMI Body mass index is 32.79 kg/m. GEN: overweight, in no acute distress, pleasant HEENT: normal  Neck: no JVD, carotid bruits, or masses Cardiac: RRR; no murmurs, rubs, or gallops,no edema  Respiratory:  clear to auscultation bilaterally, normal work of breathing GI: soft, nontender, nondistended, + BS MS: no deformity or atrophy  Skin: warm and dry  Neuro:  Strength and sensation are intact Psych: euthymic mood, full affect  Recent Labs: 06/01/2016: ALT 15; BUN 13; Creatinine 0.8; Hemoglobin 14.7; Platelets 262; Potassium 4.3; Sodium 141; TSH 2.04 06/13/2016: Pro B Natriuretic peptide (BNP) 69.0  Lipid Panel     Component Value Date/Time   CHOL 221 (A) 06/01/2016   TRIG 123 06/01/2016   HDL 80 (A) 06/01/2016   CHOLHDL 3 12/29/2009 0923   VLDL 27.2 12/29/2009 0923   LDLCALC 116 06/01/2016   LDLDIRECT 133.4 12/29/2009 0923     Wt Readings from Last 3 Encounters:  11/21/16 209 lb 6 oz (95 kg)  08/11/16 209 lb 9.6 oz (95.1 kg)  07/13/16 212 lb 3.2 oz (96.3 kg)      Other studies Reviewed: Additional studies/ records that were reviewed today include: echo and myoview are reviewed at length with the patient today   ASSESSMENT AND PLAN:  1.  SOB  Likely due to deconditioning.  She is overweight and not very activity.  Myoview and echo are low risk.  Will order PFTs Regular exercise is  encouraged  2. Obesity Body mass index is 32.79 kg/m. Regular exercise and weight reduction encouraged.  3. Snoring I have encouraged sleep study which she declines again today  Follow-up:  Return to see me as needed    Signed, Thompson Grayer, MD  11/21/2016 4:30 PM     [place] [address] [phone] (office) [phone] (fax)

## 2016-11-21 NOTE — Patient Instructions (Signed)
Medication Instructions:  Your physician recommends that you continue on your current medications as directed. Please refer to the Current Medication list given to you today.    Labwork: None ordered   Testing/Procedures: Your physician has recommended that you have a pulmonary function test. Pulmonary Function Tests are a group of tests that measure how well air moves in and out of your lungs.    Follow-Up: Your physician recommends that you schedule a follow-up appointment as needed.  Will call with test results   Any Other Special Instructions Will Be Listed Below (If Applicable).     If you need a refill on your cardiac medications before your next appointment, please call your pharmacy.

## 2016-11-22 ENCOUNTER — Ambulatory Visit (INDEPENDENT_AMBULATORY_CARE_PROVIDER_SITE_OTHER): Payer: Medicare Other | Admitting: Internal Medicine

## 2016-11-22 DIAGNOSIS — R0602 Shortness of breath: Secondary | ICD-10-CM | POA: Diagnosis not present

## 2016-11-22 LAB — PULMONARY FUNCTION TEST
DL/VA % pred: 106 %
DL/VA: 5.25 ml/min/mmHg/L
DLCO COR % PRED: 76 %
DLCO UNC: 20.15 ml/min/mmHg
DLCO cor: 19.68 ml/min/mmHg
DLCO unc % pred: 78 %
FEF 25-75 POST: 0.7 L/s
FEF 25-75 Pre: 1.07 L/sec
FEF2575-%Change-Post: -34 %
FEF2575-%Pred-Post: 48 %
FEF2575-%Pred-Pre: 74 %
FEV1-%CHANGE-POST: -7 %
FEV1-%Pred-Post: 66 %
FEV1-%Pred-Pre: 71 %
FEV1-PRE: 1.45 L
FEV1-Post: 1.35 L
FEV1FVC-%Change-Post: 0 %
FEV1FVC-%Pred-Pre: 100 %
FEV6-%Change-Post: -7 %
FEV6-%PRED-POST: 70 %
FEV6-%Pred-Pre: 75 %
FEV6-Post: 1.81 L
FEV6-Pre: 1.95 L
FEV6FVC-%CHANGE-POST: 0 %
FEV6FVC-%PRED-PRE: 106 %
FEV6FVC-%Pred-Post: 105 %
FVC-%CHANGE-POST: -6 %
FVC-%PRED-POST: 66 %
FVC-%Pred-Pre: 71 %
FVC-Post: 1.82 L
FVC-Pre: 1.95 L
POST FEV1/FVC RATIO: 74 %
Post FEV6/FVC ratio: 100 %
Pre FEV1/FVC ratio: 74 %
Pre FEV6/FVC Ratio: 100 %
RV % pred: 105 %
RV: 2.58 L
TLC % pred: 91 %
TLC: 4.78 L

## 2016-11-22 NOTE — Progress Notes (Signed)
PFT done today. 

## 2016-12-01 ENCOUNTER — Telehealth: Payer: Self-pay | Admitting: Internal Medicine

## 2016-12-01 NOTE — Telephone Encounter (Signed)
Patient wanted results of her PFT. Informed patient that Dr. Jackalyn Lombard nurse is out this week and will call her next week when she had the results. Patient verbalized understanding.

## 2016-12-01 NOTE — Telephone Encounter (Signed)
Follow Up:    Pt says she would like her Stress Test results from 11-04-16 please.

## 2016-12-05 ENCOUNTER — Other Ambulatory Visit: Payer: Self-pay | Admitting: Family Medicine

## 2016-12-05 DIAGNOSIS — K219 Gastro-esophageal reflux disease without esophagitis: Secondary | ICD-10-CM

## 2016-12-05 NOTE — Telephone Encounter (Signed)
Discussed with Dr Rayann Heman and he says the PFT's do not show any reason she would be short of breath.  Patient aware and will follow up as needed.

## 2016-12-05 NOTE — Telephone Encounter (Signed)
Follow up    Pt is calling for the results of her breathing test she had done.

## 2017-02-13 ENCOUNTER — Other Ambulatory Visit: Payer: Self-pay | Admitting: Family Medicine

## 2017-02-13 DIAGNOSIS — K219 Gastro-esophageal reflux disease without esophagitis: Secondary | ICD-10-CM

## 2017-04-26 ENCOUNTER — Other Ambulatory Visit: Payer: Self-pay | Admitting: Family Medicine

## 2017-04-26 DIAGNOSIS — K219 Gastro-esophageal reflux disease without esophagitis: Secondary | ICD-10-CM

## 2017-06-16 ENCOUNTER — Other Ambulatory Visit: Payer: Self-pay | Admitting: Family Medicine

## 2017-06-16 DIAGNOSIS — K219 Gastro-esophageal reflux disease without esophagitis: Secondary | ICD-10-CM

## 2017-08-25 ENCOUNTER — Other Ambulatory Visit: Payer: Self-pay | Admitting: Family Medicine

## 2017-08-25 DIAGNOSIS — K219 Gastro-esophageal reflux disease without esophagitis: Secondary | ICD-10-CM

## 2018-01-03 ENCOUNTER — Other Ambulatory Visit: Payer: Self-pay | Admitting: Family Medicine

## 2018-01-03 DIAGNOSIS — K219 Gastro-esophageal reflux disease without esophagitis: Secondary | ICD-10-CM

## 2018-01-04 ENCOUNTER — Other Ambulatory Visit: Payer: Self-pay | Admitting: Family Medicine

## 2018-01-04 DIAGNOSIS — K219 Gastro-esophageal reflux disease without esophagitis: Secondary | ICD-10-CM

## 2018-04-03 ENCOUNTER — Other Ambulatory Visit: Payer: Self-pay | Admitting: Family Medicine

## 2018-04-03 DIAGNOSIS — K219 Gastro-esophageal reflux disease without esophagitis: Secondary | ICD-10-CM

## 2018-04-27 ENCOUNTER — Other Ambulatory Visit: Payer: Self-pay | Admitting: Family Medicine

## 2018-04-27 DIAGNOSIS — K219 Gastro-esophageal reflux disease without esophagitis: Secondary | ICD-10-CM

## 2018-06-04 ENCOUNTER — Encounter: Payer: Self-pay | Admitting: Internal Medicine

## 2018-06-18 ENCOUNTER — Ambulatory Visit: Payer: Medicare Other | Admitting: Internal Medicine

## 2018-06-18 ENCOUNTER — Encounter (INDEPENDENT_AMBULATORY_CARE_PROVIDER_SITE_OTHER): Payer: Self-pay

## 2018-06-18 ENCOUNTER — Encounter: Payer: Self-pay | Admitting: Internal Medicine

## 2018-06-18 VITALS — BP 134/82 | HR 62 | Ht 67.0 in | Wt 195.2 lb

## 2018-06-18 DIAGNOSIS — R0683 Snoring: Secondary | ICD-10-CM

## 2018-06-18 DIAGNOSIS — I350 Nonrheumatic aortic (valve) stenosis: Secondary | ICD-10-CM | POA: Diagnosis not present

## 2018-06-18 DIAGNOSIS — R0602 Shortness of breath: Secondary | ICD-10-CM | POA: Diagnosis not present

## 2018-06-18 NOTE — Patient Instructions (Addendum)
Medication Instructions:  Your physician recommends that you continue on your current medications as directed. Please refer to the Current Medication list given to you today.  Labwork: None ordered.  Testing/Procedures: Your physician has requested that you have an echocardiogram. Echocardiography is a painless test that uses sound waves to create images of your heart. It provides your doctor with information about the size and shape of your heart and how well your heart's chambers and valves are working. This procedure takes approximately one hour. There are no restrictions for this procedure.  PLEASE SCHEDULE FOR ECHO SAME DAY AS FOLLOW UP VISIT.  Follow-Up: Your physician wants you to follow-up in: 6 months with Truitt Merle, NP.    Please schedule for an ECHO same day.   You will receive a reminder letter in the mail two months in advance. If you don't receive a letter, please call our office to schedule the follow-up appointment.   Any Other Special Instructions Will Be Listed Below (If Applicable).  If you need a refill on your cardiac medications before your next appointment, please call your pharmacy.

## 2018-06-18 NOTE — Progress Notes (Signed)
   PCP: Moshe Cipro, MD   Primary EP: Dr Jacolyn Reedy is a 84 y.o. female who presents today for routine electrophysiology followup.  Since last being seen in our clinic, the patient reports doing very well.  She still has occasional SOB but this did improve substantially with 20 lbs weight loss previously. Today, she denies symptoms of palpitations, chest pain, shortness of breath,  lower extremity edema, dizziness, presyncope, or syncope.  The patient is otherwise without complaint today.   Past Medical History:  Diagnosis Date  . Anxiety   . GERD (gastroesophageal reflux disease)   . Hiatal hernia   . Insomnia   . Osteopenia   . Right knee DJD   . Vulvar cancer [facility])    Past Surgical History:  Procedure Laterality Date  . COSMETIC SURGERY    . TONSILLECTOMY AND ADENOIDECTOMY    . TUBAL LIGATION    . VULVA SURGERY      ROS- all systems are reviewed and negatives except as per HPI above  Current Outpatient Medications  Medication Sig Dispense Refill  . acyclovir (ZOVIRAX) 200 MG capsule Take 200-1,000 mg by mouth daily. Take as directed    . Cholecalciferol (VITAMIN D PO) Take 1 capsule by mouth daily.    Marland Kitchen escitalopram (LEXAPRO) 10 MG tablet Take 10 mg by mouth daily.     Marland Kitchen esomeprazole (NEXIUM) 40 MG capsule TAKE  (1)  CAPSULE  TWICE DAILY (TAKE ON AN EMPTY STOMACH AT LEAST 30MIN- UTES BEFORE MEALS). 60 capsule 0  . ferrous sulfate 325 (65 FE) MG tablet Take 325 mg by mouth daily with breakfast.    . metoprolol succinate (TOPROL-XL) 50 MG 24 hr tablet Take 0.5 tablets by mouth daily.    . Misc Natural Products (OSTEO BI-FLEX JOINT SHIELD PO) Take 1 tablet by mouth daily.    . Multiple Vitamin (MULTIVITAMIN) capsule Take 1 capsule by mouth daily.     No current facility-administered medications for this visit.     Physical Exam: Vitals:   06/18/18 1550  BP: 134/82  Pulse: 62  SpO2: 97%  Weight: 195 lb 3.2 oz (88.5 kg)  Height: 5\' 7"  (1.702 m)    GEN-  The patient is well appearing, alert and oriented x 3 today.   Head- normocephalic, atraumatic Eyes-  Sclera clear, conjunctiva pink Ears- hearing intact Oropharynx- clear Lungs- Clear to ausculation bilaterally, normal work of breathing Heart- Regular rate and rhythm, 2/6 SEM LUSB mid peaking GI- soft, NT, ND, + BS Extremities- no clubbing, cyanosis, or edema  Wt Readings from Last 3 Encounters:  06/18/18 195 lb 3.2 oz (88.5 kg)  11/21/16 209 lb 6 oz (95 kg)  08/11/16 209 lb 9.6 oz (95.1 kg)    EKG tracing ordered today is personally reviewed and shows sinus rhythm, normal ekg  Assessment and Plan:  1. SOB Previously evaluated with myoview/ echo/ pfts Improved with 20 lb weight loss  Regular activity and ongoing weight loss encouraged  2. Obesity Body mass index is 30.57 kg/m. Lifestyle modification encouraged  3. Snoring She has declined sleep study  4. Aortic stenosis Recent echo from [facility] is reviewed and reveals heavily calcified aortic valve with moderate AS She has been started on toprol and is doing well We will repeat an echo in 6 months  Return to see Cecille Rubin in 6 months  Thompson Grayer MD, [facility] 06/18/2018 4:21 PM

## 2018-12-18 ENCOUNTER — Telehealth: Payer: Self-pay | Admitting: Cardiovascular Disease

## 2018-12-18 NOTE — Telephone Encounter (Signed)
   Primary Cardiologist:  Allred   Patient contacted.  History reviewed.  Talked with patient . She has no worsening of her symptoms Has only moderate AS by previous echo  I think its save to delay the echo priority level 3     No symptoms to suggest any unstable cardiac conditions.  Based on discussion, with current pandemic situation, we will be postponing this appointment for Mackenzie Jensen with a plan for f/u in  12  wks or sooner if feasible/necessary.  If symptoms change, she has been instructed to contact our office.   Routing to C19 CANCEL pool for tracking (P CV DIV CV19 CANCEL - reason for visit "other.") and assigning priority (  3 = >12 wks).   Mertie Moores, MD  12/18/2018 5:22 PM         .

## 2018-12-20 ENCOUNTER — Telehealth: Payer: Self-pay | Admitting: *Deleted

## 2018-12-20 NOTE — Telephone Encounter (Signed)

## 2018-12-24 ENCOUNTER — Telehealth: Payer: Self-pay | Admitting: *Deleted

## 2018-12-24 NOTE — Progress Notes (Signed)
Telehealth Visit     Virtual Visit via Video Note   This visit type was conducted due to national recommendations for restrictions regarding the COVID-19 Pandemic (e.g. social distancing) in an effort to limit this patient's exposure and mitigate transmission in our community.  Due to her co-morbid illnesses, this patient is at least at moderate risk for complications without adequate follow up.  This format is felt to be most appropriate for this patient at this time.  All issues noted in this document were discussed and addressed.  A limited physical exam was performed with this format.  Please refer to the patient's chart for her consent to telehealth for [facility].   Evaluation Performed:  Follow-up visit  This visit type was conducted due to national recommendations for restrictions regarding the COVID-19 Pandemic (e.g. social distancing).  This format is felt to be most appropriate for this patient at this time.  All issues noted in this document were discussed and addressed.  No physical exam was performed (except for noted visual exam findings with Video Visits).  Please refer to the patient's chart (MyChart message for video visits and phone note for telephone visits) for the patient's consent to telehealth for [facility].  Date:  12/25/2018   ID:  Mackenzie Jensen, DOB 25-Aug-1934, MRN [id number]  Patient Location:  Home  Provider location:   Home  PCP:  Moshe Cipro, MD  Cardiologist:  [place] Electrophysiologist: [place]  Chief Complaint:  Follow up visit.   History of Present Illness:    Mackenzie Jensen is a 85 y.o. female who presents via audio/video conferencing for a telehealth visit today.  Seen for Dr. Rayann Heman.   She has a history of shortness of breath that was evaluated with Myoview, echo and PFTS - improved with weight loss. She has AS (moderate by echo 6 months ago in [place]) and untreated OSA along with obesity. Last seen by Dr. Rayann Heman in October -  was felt to be stable. AS felt to be moderate. Toprol was started.   The patient does not have symptoms concerning for COVID-19 infection (fever, chills, cough, or new shortness of breath).   Seen today via D[employer]im[employer] video - her daughter is with her and is a Marine scientist. Mackenzie Jensen has consented for this visit. She was to have had her echo as well - this was postponed and given level 3 priority by Dr. Acie Fredrickson (could wait 12 weeks due to [place]).   She is doing ok. No chest pain. Breathing is stable. No syncope. Bp is good. Tolerating her medicines. She notes her weight is up - she has stopped her Weight Watcher's - not walking due to the stay at home orders - she really liked to go out and shop and do her walking that way. She does not say what her weight is back up to - just knows that she has gained. No chest pain. Some shortness of breath but she notes its from weight gain. Tolerating her medicines. Labs are done by PCP. She has no real concerns. She does wish to get her echo done at [facility].   Past Medical History:  Diagnosis Date  . Anxiety   . GERD (gastroesophageal reflux disease)   . Hiatal hernia   . Insomnia   . Osteopenia   . Right knee DJD   . Vulvar cancer [facility])    Past Surgical History:  Procedure Laterality Date  . COSMETIC SURGERY    . TONSILLECTOMY AND  ADENOIDECTOMY    . TUBAL LIGATION    . VULVA SURGERY       Current Meds  Medication Sig  . celecoxib (CELEBREX) 200 MG capsule Take 200 mg by mouth daily.   . cetirizine (ZYRTEC) 10 MG tablet Take 10 mg by mouth daily.  . Cholecalciferol (VITAMIN D PO) Take 1 capsule by mouth daily.  Marland Kitchen escitalopram (LEXAPRO) 10 MG tablet Take 10 mg by mouth daily.   Marland Kitchen esomeprazole (NEXIUM) 40 MG capsule TAKE  (1)  CAPSULE  TWICE DAILY (TAKE ON AN EMPTY STOMACH AT LEAST 30MIN- UTES BEFORE MEALS).  . ferrous sulfate 325 (65 FE) MG tablet Take 325 mg by mouth daily with breakfast.  . metoprolol succinate (TOPROL-XL) 50 MG 24 hr tablet Take 25  mg by mouth daily.   . Misc Natural Products (OSTEO BI-FLEX JOINT SHIELD PO) Take 1 tablet by mouth daily.  . Multiple Vitamin (MULTIVITAMIN) capsule Take 1 capsule by mouth daily.     Allergies:   Dorzolamide hcl-timolol mal and Novocain [procaine]   Social History   Tobacco Use  . Smoking status: Former Smoker    Packs/day: 1.00    Years: 30.00    Pack years: 30.00    Types: Cigarettes  . Smokeless tobacco: Never Used  . Tobacco comment: Quit 30 years ago  Substance Use Topics  . Alcohol use: No  . Drug use: No     Family Hx: The patient's family history includes CAD (age of onset: 31) in her brother; CAD (age of onset: 1) in her father.  ROS:   Please see the history of present illness.   All other systems reviewed are negative.    Objective:    Vital Signs:  BP 124/78 Comment: twice/120/80  Ht 5\' 6"  (1.676 m)   BMI 31.51 kg/m    Wt Readings from Last 3 Encounters:  06/18/18 195 lb 3.2 oz (88.5 kg)  11/21/16 209 lb 6 oz (95 kg)  08/11/16 209 lb 9.6 oz (95.1 kg)    Alert female in no acute distress. Not short of breath with conversation. Appropriate in responses.    Labs/Other Tests and Data Reviewed:    Lab Results  Component Value Date   WBC 6.3 06/01/2016   HGB 14.7 06/01/2016   HCT 43 06/01/2016   PLT 262 06/01/2016   GLUCOSE 104 (H) 02/23/2010   CHOL 221 (A) 06/01/2016   TRIG 123 06/01/2016   HDL 80 (A) 06/01/2016   LDLDIRECT 133.4 12/29/2009   LDLCALC 116 06/01/2016   ALT 15 06/01/2016   AST 10 (A) 06/01/2016   NA 141 06/01/2016   K 4.3 06/01/2016   CL 104 02/23/2010   CREATININE 0.8 06/01/2016   BUN 13 06/01/2016   CO2 32 02/23/2010   TSH 2.04 06/01/2016     BNP (last 3 results) No results for input(s): BNP in the last 8760 hours.  ProBNP (last 3 results) No results for input(s): PROBNP in the last 8760 hours.    Prior CV studies:    The following studies were reviewed today:         Myoview Study Highlights  10/2016   The left ventricular ejection fraction is hyperdynamic (>65%).  Nuclear stress EF: 67%.  There was no ST segment deviation noted during stress.  The study is normal.  This is a low risk study.     Echo Study Conclusions 10/2016  - Left ventricle: The cavity size was normal. Wall thickness was  increased in a pattern of mild LVH. Systolic function was normal.   The estimated ejection fraction was in the range of 60% to 65%.   Wall motion was normal; there were no regional wall motion   abnormalities. Doppler parameters are consistent with abnormal   left ventricular relaxation (grade 1 diastolic dysfunction). The   E/e&' ratio is >15, suggesting elevated LV filling pressure. - Aortic valve: Trileaflet. Mildly calcified with mild to moderate   stenosis. There was no regurgitation. Mean gradient (S): 13 mm   Hg. Peak gradient (S): 23 mm Hg. Valve area (VTI): 1.28 cm^2.   Valve area (Vmean): 1.34 cm^2. - Mitral valve: Mildly thickened leaflets . There was trivial   regurgitation. - Inferior vena cava: The vessel was normal in size. The   respirophasic diameter changes were in the normal range (>= 50%),   consistent with normal central venous pressure.  Impressions:  - LVEF 60-65%, mild LVH, normal wall motion, grade 1 DD with   elevated LV filling pressure, trileaflet calcified aortic valve   with mild to moderate stenosis, trivial MR, normal IVC.    ASSESSMENT & PLAN:    1.  Dyspnea - prior echo, myoview and PFTs - improved previously with weight loss - her symptoms sound stable - she has gained weight again.   2. Obesity - she is encouraged to get back to walking and get back to Weight Watchers - she has been successful with this in the past.   3. Moderate AS - will get her echo for later this summer - no cardinal symptoms noted - she was reminded of what to be on the lookout for.   4. Probable OSA - untreated - has declined sleep study  5. COVID-19  Education: The signs and symptoms of COVID-19 were discussed with the patient and how to seek care for testing (follow up with PCP or arrange E-visit).  The importance of social distancing, staying at home and hand hygiene were discussed today.  Patient Risk:   After full review of this patient's clinical status, I feel that they are at least moderate risk at this time.  Time:   Today, I have spent 18 minutes with the patient with telehealth technology discussing the above issues.     Medication Adjustments/Labs and Tests Ordered: Current medicines are reviewed at length with the patient today.  Concerns regarding medicines are outlined above.   Tests Ordered: No orders of the defined types were placed in this encounter.   Medication Changes: No orders of the defined types were placed in this encounter.   Disposition:  FU with Dr. Rayann Heman in October. Plan for echo later this summer. I am happy to see back as needed.    Patient is agreeable to this plan and will call if any problems develop in the interim.   Amie Critchley, NP  12/25/2018 3:22 PM    [facility]

## 2018-12-24 NOTE — Telephone Encounter (Signed)
lvm pt is not too show up at office tomorrow for echo, Dr. Acie Fredrickson stated ok to push out echo for several weeks.   Pt is still to have her VT visit tomorrow with Truitt Merle, NP.  Echo appt was cancelled for tomorrow per Dr.Nahser.

## 2018-12-25 ENCOUNTER — Encounter (HOSPITAL_COMMUNITY): Payer: Self-pay

## 2018-12-25 ENCOUNTER — Encounter: Payer: Self-pay | Admitting: Nurse Practitioner

## 2018-12-25 ENCOUNTER — Other Ambulatory Visit: Payer: Self-pay

## 2018-12-25 ENCOUNTER — Telehealth (INDEPENDENT_AMBULATORY_CARE_PROVIDER_SITE_OTHER): Payer: Medicare Other | Admitting: Nurse Practitioner

## 2018-12-25 ENCOUNTER — Ambulatory Visit (HOSPITAL_COMMUNITY): Payer: Medicare Other

## 2018-12-25 VITALS — BP 124/78 | Ht 66.0 in

## 2018-12-25 DIAGNOSIS — R0602 Shortness of breath: Secondary | ICD-10-CM

## 2018-12-25 DIAGNOSIS — I350 Nonrheumatic aortic (valve) stenosis: Secondary | ICD-10-CM

## 2018-12-25 DIAGNOSIS — Z7189 Other specified counseling: Secondary | ICD-10-CM

## 2018-12-25 NOTE — Patient Instructions (Addendum)
After Visit Summary:  We will be checking the following labs today - NONE  Medication Instructions:    Continue with your current medicines.    If you need a refill on your cardiac medications before your next appointment, please call your pharmacy.     Testing/Procedures To Be Arranged:  Echo in June please   Follow-Up:   See Dr. Rayann Heman in October - needs recall - likes to come with her husband.     At [facility], you and your health needs are our priority.  As part of our continuing mission to provide you with exceptional heart care, we have created designated Provider Care Teams.  These Care Teams include your primary Cardiologist (physician) and Advanced Practice Providers (APPs -  Physician Assistants and Nurse Practitioners) who all work together to provide you with the care you need, when you need it.  Special Instructions:  . Stay safe, stay home and wash your hands for at least 20 seconds! . GET BACK TO WALKING FOR [place] . It was good to see you today!  Call the [place] office at [phone] if you have any questions, problems or concerns.

## 2019-02-22 ENCOUNTER — Telehealth (HOSPITAL_COMMUNITY): Payer: Self-pay | Admitting: *Deleted

## 2019-02-22 NOTE — Telephone Encounter (Signed)

## 2019-02-25 ENCOUNTER — Telehealth: Payer: Self-pay

## 2019-02-25 ENCOUNTER — Ambulatory Visit (HOSPITAL_COMMUNITY): Payer: Medicare Other | Attending: Cardiology

## 2019-02-25 ENCOUNTER — Other Ambulatory Visit: Payer: Self-pay

## 2019-02-25 DIAGNOSIS — I350 Nonrheumatic aortic (valve) stenosis: Secondary | ICD-10-CM

## 2019-02-25 NOTE — Telephone Encounter (Signed)
-----   Message from Holley Dexter sent at 02/25/2019  1:52 PM EDT ----- Blythe Stanford, Per Patient, her Dentist would like to use sleep sedation, and the dentist would like Dr. Jackalyn Lombard permission. I have verified phone numbers on file. Pt would like a call. Thanks renee

## 2019-02-25 NOTE — Telephone Encounter (Signed)
Left detailed message for Pt.  Advised dental office would need to fax request for clearance to our clearance pool.  Gave fax # [phone]  Advised to call office if any further questions.

## 2019-02-26 ENCOUNTER — Telehealth: Payer: Self-pay | Admitting: Internal Medicine

## 2019-02-26 NOTE — Telephone Encounter (Signed)
Pt is aware of echo results; she has no additional questions.

## 2019-02-26 NOTE — Telephone Encounter (Signed)
New Message   Patient is calling to obtain results of her echocardiogram. Please call.

## 2019-07-08 ENCOUNTER — Telehealth: Payer: Medicare Other | Admitting: Internal Medicine

## 2019-07-12 ENCOUNTER — Telehealth: Payer: Medicare Other | Admitting: Internal Medicine

## 2019-10-01 ENCOUNTER — Telehealth: Payer: Self-pay | Admitting: Internal Medicine

## 2019-10-01 NOTE — Progress Notes (Signed)
CARDIOLOGY OFFICE NOTE  Date:  10/02/2019    Mackenzie Jensen Date of Birth: 08/15/1934 Medical Record [id number]  PCP:  Moshe Cipro, MD  Cardiologist:  [facility]   Chief Complaint  Patient presents with  . Chest Pain    Work in visit -seen for Dr. Rayann Heman    History of Present Illness: Mackenzie Jensen is a 86 y.o. female who presents today for a work in visit. Seen for Dr. Rayann Heman.   She has a history of shortness of breath that was evaluated in the past with Myoview, echo and PFTS - improved with weight loss. She has moderate AS and untreated OSA along with obesity. Last seen by Dr. Rayann Heman in October of 2019 - was felt to be stable. AS felt to be moderate. Toprol was started.   I then did a telehealth visit with her back in April - felt to be doing ok. Follow up echo had been postponed due to [place] and was done later in the summer. Weight was back up due to the pandemic. Overall felt to be doing ok.   Phone call yesterday - "Spoke with patient who states he was awakened last night by a pain between her shoulder blades that worsened with deep breath. She was able to start to go back to sleep by not breathing too deeply when shortly after her gums where she does not have teeth starting aching. States she has bad acid reflux and her meatloaf dinner could have exacerbated the symptoms.  She also has echo from 6/20 showing moderate to severe AS but she has not been seen in our office since 10/19. No pain or discomfort today. Denies recent hx chest discomfort, syncope, pre-syncope, or other concerns. States she has chronic SOB that has been attributed to her weight and she has gained an additional 15 lb since Christmas. I scheduled her to see Truitt Merle, NP tomorrow 2/3 and she was grateful for our help".   Thus added to my schedule for today.   The patient does not have symptoms concerning for COVID-19 infection (fever, chills, cough, or new shortness of breath).    Comes in today. Here alone. She notes she has had 2 "scares" - she notes she is trying to get better with being a "hypochondriac". She notes she reflux. Night before last night she laid down and had pain between her shoulder blades - then everywhere she does not have teeth hurt - she got scared - took a Tylenol and got better. She did not sleep all that night. She had more frequent urination that night. She had this same episode about 6 weeks ago - was then told she had GERD. She had had potato chips with the first spell and meatloaf with this most recent. She is on chronic PPI. Also on Celebrex chronically. Still has gallbladder. These spells lasted all during the night but would go away with standing up. No issues during the day. She says she is "so short of breath" - even right now - may be a bit worse but has gained more weight also. She says she is ready to get back on track with losing weight not due to these 2 spells that have scared her. "Watching TV and eating all day" now due to the pandemic. No syncope. She has had both vaccines.   Past Medical History:  Diagnosis Date  . Anxiety   . GERD (gastroesophageal reflux disease)   . Hiatal hernia   .  Insomnia   . Osteopenia   . Right knee DJD   . Vulvar cancer [facility])     Past Surgical History:  Procedure Laterality Date  . COSMETIC SURGERY    . TONSILLECTOMY AND ADENOIDECTOMY    . TUBAL LIGATION    . VULVA SURGERY       Medications: Current Meds  Medication Sig  . celecoxib (CELEBREX) 200 MG capsule Take 200 mg by mouth daily.   . cetirizine (ZYRTEC) 10 MG tablet Take 10 mg by mouth daily.  . Cholecalciferol (VITAMIN D PO) Take 1 capsule by mouth daily.  Marland Kitchen escitalopram (LEXAPRO) 10 MG tablet Take 10 mg by mouth daily.   Marland Kitchen esomeprazole (NEXIUM) 40 MG capsule TAKE  (1)  CAPSULE  TWICE DAILY (TAKE ON AN EMPTY STOMACH AT LEAST 30MIN- UTES BEFORE MEALS).  . ferrous sulfate 325 (65 FE) MG tablet Take 325 mg by mouth daily with  breakfast.  . metoprolol succinate (TOPROL-XL) 50 MG 24 hr tablet Take 25 mg by mouth daily.   . Misc Natural Products (OSTEO BI-FLEX JOINT SHIELD PO) Take 1 tablet by mouth daily.  . Multiple Vitamin (MULTIVITAMIN) capsule Take 1 capsule by mouth daily.     Allergies: Allergies  Allergen Reactions  . Dorzolamide Hcl-Timolol Mal Shortness Of Breath  . Novocain [Procaine] Shortness Of Breath and Other (See Comments)    Social History: The patient  reports that she has quit smoking. Her smoking use included cigarettes. She has a 30.00 pack-year smoking history. She has never used smokeless tobacco. She reports that she does not drink alcohol or use drugs.   Family History: The patient's family history includes CAD (age of onset: 72) in her brother; CAD (age of onset: 61) in her father.   Review of Systems: Please see the history of present illness.   All other systems are reviewed and negative.   Physical Exam: VS:  BP (!) 90/50   Pulse 71   Ht 5\' 6"  (1.676 m)   Wt 209 lb 12.8 oz (95.2 kg)   SpO2 93%   BMI 33.86 kg/m  .  BMI Body mass index is 33.86 kg/m.  Wt Readings from Last 3 Encounters:  10/02/19 209 lb 12.8 oz (95.2 kg)  06/18/18 195 lb 3.2 oz (88.5 kg)  11/21/16 209 lb 6 oz (95 kg)   Repeat BP by me is 100/60  General: Pleasant. Alert and in no acute distress.  She seems a bit anxious.  HEENT: Normal.  Neck: Supple, no JVD, carotid bruits, or masses noted.  Cardiac: Regular rate and rhythm. Harsh outflow murmur that radiates thru out the precordium. No edema.  Respiratory:  Lungs are clear to auscultation bilaterally with normal work of breathing.  GI: Soft and nontender.  MS: No deformity or atrophy. Gait and ROM intact.  Skin: Warm and dry. Color is normal.  Neuro:  Strength and sensation are intact and no gross focal deficits noted.  Psych: Alert, appropriate and with normal affect.   LABORATORY DATA:  EKG:  EKG is ordered today. This demonstrates NSR and  is changed.   Lab Results  Component Value Date   WBC 6.3 06/01/2016   HGB 14.7 06/01/2016   HCT 43 06/01/2016   PLT 262 06/01/2016   GLUCOSE 104 (H) 02/23/2010   CHOL 221 (A) 06/01/2016   TRIG 123 06/01/2016   HDL 80 (A) 06/01/2016   LDLDIRECT 133.4 12/29/2009   LDLCALC 116 06/01/2016   ALT 15 06/01/2016   AST  10 (A) 06/01/2016   NA 141 06/01/2016   K 4.3 06/01/2016   CL 104 02/23/2010   CREATININE 0.8 06/01/2016   BUN 13 06/01/2016   CO2 32 02/23/2010   TSH 2.04 06/01/2016     BNP (last 3 results) No results for input(s): BNP in the last 8760 hours.  ProBNP (last 3 results) No results for input(s): PROBNP in the last 8760 hours.   Other Studies Reviewed Today:  ECHO IMPRESSIONS 01/2019  1. The left ventricle has low normal systolic function, with an ejection  fraction of 50-55%. The cavity size was normal. There is mildly increased  left ventricular wall thickness. Left ventricular diastolic Doppler  parameters are consistent with  pseudonormalization. Elevated mean left atrial pressure.  2. The right ventricle has normal systolic function. The cavity was  normal.  3. The mitral valve is grossly normal. There is mild mitral annular  calcification present.  4. The tricuspid valve is grossly normal.  5. The aortic valve is tricuspid. Severe calcifcation of the aortic  valve. Aortic valve regurgitation is trivial by color flow Doppler.  Moderate-severe stenosis of the aortic valve.  6. Normal LV systolic function; mild LVH; moderate diastolic dysfunction;  calcified aortic valve with moderate to severe AS (mean gradient 37 mmHg);  trace AI.     Myoview Study Highlights 10/2016   The left ventricular ejection fraction is hyperdynamic (>65%).  Nuclear stress EF: 67%.  There was no ST segment deviation noted during stress.  The study is normal.  This is a low risk study.    Echo Study Conclusions 10/2016  - Left ventricle: The cavity size was  normal. Wall thickness was increased in a pattern of mild LVH. Systolic function was normal. The estimated ejection fraction was in the range of 60% to 65%. Wall motion was normal; there were no regional wall motion abnormalities. Doppler parameters are consistent with abnormal left ventricular relaxation (grade 1 diastolic dysfunction). The E/e&' ratio is >15, suggesting elevated LV filling pressure. - Aortic valve: Trileaflet. Mildly calcified with mild to moderate stenosis. There was no regurgitation. Mean gradient (S): 13 mm Hg. Peak gradient (S): 23 mm Hg. Valve area (VTI): 1.28 cm^2. Valve area (Vmean): 1.34 cm^2. - Mitral valve: Mildly thickened leaflets . There was trivial regurgitation. - Inferior vena cava: The vessel was normal in size. The respirophasic diameter changes were in the normal range (>= 50%), consistent with normal central venous pressure.  Impressions:  - LVEF 60-65%, mild LVH, normal wall motion, grade 1 DD with elevated LV filling pressure, trileaflet calcified aortic valve with mild to moderate stenosis, trivial MR, normal IVC.    ASSESSMENT & PLAN:    1.  Chest pain/dyspnea - has known AS that was mod/severe from June - will get Myoview and echo updated. If this turns out ok, would favor abdominal ultrasound to check gallbladder. Further disposition to follow. Baseline lab today.    2. Chronic Dyspnea - prior echo, myoview and PFTs - improved previously with weight loss - she feels her breathing has gotten worse - weight is back up but also with mod/severe AS - see above.    3. Obesity - she is trying to get back on track.   4. AS - see above.   5. Probable OSA - untreated   6. COVID-19 Education: The signs and symptoms of COVID-19 were discussed with the patient and how to seek care for testing (follow up with PCP or arrange E-visit).  The importance  of social distancing, staying at home, hand hygiene and wearing a  mask when out in public were discussed today. She has had both vaccines.   Current medicines are reviewed with the patient today.  The patient does not have concerns regarding medicines other than what has been noted above.  The following changes have been made:  See above.  Labs/ tests ordered today include:    Orders Placed This Encounter  Procedures  . EKG 12-Lead     Disposition:   Further disposition pending. Otherwise, I will plan on seeing her back in 6 months.  Patient is agreeable to this plan and will call if any problems develop in the interim.   SignedTruitt Merle, NP  10/02/2019 3:35 PM  [facility] [address] [place], [place]  [address] Phone: [phone] Fax: [phone]

## 2019-10-01 NOTE — Telephone Encounter (Signed)
Spoke with patient who states he was awakened last night by a pain between her shoulder blades that worsened with deep breath. She was able to start to go back to sleep by not breathing too deeply when shortly after her gums where she does not have teeth starting aching. States she has bad acid reflux and her meatloaf dinner could have exacerbated the symptoms.  She also has echo from 6/20 showing moderate to sever AS but she has not been seen in our office since 10/19. No pain or discomfort today. Denies recent hx chest discomfort, syncope, pre-syncope, or other concerns. States she has chronic SOB that has been attributed to her weight and she has gained an additional 15 lb since Christmas. I scheduled her to see Truitt Merle, NP tomorrow 2/3 and she was grateful for our help.

## 2019-10-01 NOTE — Telephone Encounter (Signed)
Patient is calling stating she started experiencing pain in her back between her shoulder blades when she breathes along with pain in gums last night 09/30/19. She states the pain lasted for several hours and she had to take an Asprin and sit up in order to start feeling better. Please advise.

## 2019-10-02 ENCOUNTER — Other Ambulatory Visit: Payer: Self-pay

## 2019-10-02 ENCOUNTER — Ambulatory Visit: Payer: Medicare PPO | Admitting: Nurse Practitioner

## 2019-10-02 ENCOUNTER — Encounter: Payer: Self-pay | Admitting: Nurse Practitioner

## 2019-10-02 VITALS — BP 90/50 | HR 71 | Ht 66.0 in | Wt 209.8 lb

## 2019-10-02 DIAGNOSIS — I350 Nonrheumatic aortic (valve) stenosis: Secondary | ICD-10-CM | POA: Diagnosis not present

## 2019-10-02 DIAGNOSIS — Z7189 Other specified counseling: Secondary | ICD-10-CM

## 2019-10-02 DIAGNOSIS — R0602 Shortness of breath: Secondary | ICD-10-CM | POA: Diagnosis not present

## 2019-10-02 DIAGNOSIS — R079 Chest pain, unspecified: Secondary | ICD-10-CM | POA: Diagnosis not present

## 2019-10-02 NOTE — Patient Instructions (Addendum)
After Visit Summary:  We will be checking the following labs today - BMET and CBC   Medication Instructions:    Continue with your current medicines.    If you need a refill on your cardiac medications before your next appointment, please call your pharmacy.     Testing/Procedures To Be Arranged:  Echocardiogram Lexiscan Myoview    You are scheduled for a Myocardial Perfusion Imaging Study on ______________________ at ______________________________.   Please arrive 15 minutes prior to your appointment time for registration and insurance purposes.   The test will take approximately 3 to 4 hours to complete; you may bring reading material. If someone comes with you to your appointment, they will need to remain in the main lobby due to limited space in the testing area.   How to prepare for your Myocardial Perfusion test:   Do not eat or drink 3 hours prior to your test, except you may have water.    Do not consume products containing caffeine (regular or decaffeinated) 12 hours prior to your test (ex: coffee, chocolate, soda, tea)   Do bring a list of your current medications with you. If not listed below, you may take your medications as normal.   Bring any held medication to your appointment, as you may be required to take it once the test is complete.   Do wear comfortable clothes (no dresses or overalls) and walking shoes. Tennis shoes are preferred. No heels or open toed shoes.  Do not wear cologne, perfume, aftershave or lotions (deodorant is allowed).   If these instructions are not followed, you test will have to be rescheduled.   Please report to [address] for your test. If you have questions or concerns about your appointment, please call the Nuclear Lab at [phone].  If you cannot keep your appointment, please provide 24 hour notification to the Nuclear lab to avoid a possible $50 charge to your account.     Follow-Up:   Let's  see what your studies show and then we will decide about follow up.   Otherwise, I will see you in 6 months    At [facility], you and your health needs are our priority.  As part of our continuing mission to provide you with exceptional heart care, we have created designated Provider Care Teams.  These Care Teams include your primary Cardiologist (physician) and Advanced Practice Providers (APPs -  Physician Assistants and Nurse Practitioners) who all work together to provide you with the care you need, when you need it.  Special Instructions:  . Stay safe, stay home, wash your hands for at least 20 seconds and wear a mask when out in public.  . It was good to talk with you today. . Monitor your BP for [place] . Try to get back on track with your diet    Call the [place] office at [phone] if you have any questions, problems or concerns.

## 2019-10-03 LAB — CBC
Hematocrit: 38.8 % (ref 34.0–46.6)
Hemoglobin: 13.3 g/dL (ref 11.1–15.9)
MCH: 31.5 pg (ref 26.6–33.0)
MCHC: 34.3 g/dL (ref 31.5–35.7)
MCV: 92 fL (ref 79–97)
Platelets: 246 10*3/uL (ref 150–450)
RBC: 4.22 x10E6/uL (ref 3.77–5.28)
RDW: 12.8 % (ref 11.7–15.4)
WBC: 6.6 10*3/uL (ref 3.4–10.8)

## 2019-10-03 LAB — BASIC METABOLIC PANEL
BUN/Creatinine Ratio: 20 (ref 12–28)
BUN: 17 mg/dL (ref 8–27)
CO2: 23 mmol/L (ref 20–29)
Calcium: 9.3 mg/dL (ref 8.7–10.3)
Chloride: 101 mmol/L (ref 96–106)
Creatinine, Ser: 0.87 mg/dL (ref 0.57–1.00)
GFR calc Af Amer: 71 mL/min/{1.73_m2} (ref >59)
GFR calc non Af Amer: 62 mL/min/{1.73_m2} (ref >59)
Glucose: 111 mg/dL — ABNORMAL HIGH (ref 65–99)
Potassium: 4.4 mmol/L (ref 3.5–5.2)
Sodium: 140 mmol/L (ref 134–144)

## 2019-10-04 ENCOUNTER — Telehealth: Payer: Self-pay | Admitting: Nurse Practitioner

## 2019-10-04 NOTE — Telephone Encounter (Signed)
The patient has been notified of the result and verbalized understanding.  All questions (if any) were answered. Wilma Flavin, RN 10/04/2019 12:59 PM

## 2019-10-04 NOTE — Telephone Encounter (Signed)
New message   Patient is returning call for test results. Please call.

## 2019-10-14 ENCOUNTER — Ambulatory Visit (HOSPITAL_COMMUNITY): Payer: Medicare PPO | Attending: Cardiology

## 2019-10-14 ENCOUNTER — Other Ambulatory Visit: Payer: Self-pay

## 2019-10-14 DIAGNOSIS — R079 Chest pain, unspecified: Secondary | ICD-10-CM | POA: Diagnosis present

## 2019-10-14 DIAGNOSIS — I350 Nonrheumatic aortic (valve) stenosis: Secondary | ICD-10-CM | POA: Diagnosis present

## 2019-10-15 ENCOUNTER — Telehealth: Payer: Self-pay | Admitting: Cardiology

## 2019-10-15 NOTE — Telephone Encounter (Signed)
error 

## 2019-10-17 NOTE — H&P (View-Only) (Signed)
CARDIOLOGY OFFICE NOTE  Date:  10/21/2019    Mackenzie Jensen Date of Birth: 05-26-1934 Medical Record [id number]  PCP:  Moshe Cipro, MD  Cardiologist:  [facility]  Chief Complaint  Patient presents with  . Follow-up    Follow up after echo. Seen for Dr. Rayann Heman    History of Present Illness: Mackenzie Jensen is a 86 y.o. female who presents today for a pre cath/follow up visit after echo.  Seen for Dr. Rayann Heman.   She has a history of shortness of breath that was evaluated in the past with Myoview, echo and PFTS - previously improved with weight loss. Also with moderate AS and untreated OSA along with obesity. Last seen by Dr. Rayann Heman in October of 2019 - was felt to be stable. AS felt to be moderate at that time. Toprol was started.  I then did a telehealth visit with her back in April - felt to be doing ok. Follow up echo had been postponed due to [place] and was done later last summer. Weight was back up due to the pandemic. Overall felt to be doing ok.   She called earlier this month with atypical pain and ongoing SOB. I then saw her in the office - arranged follow up echo and Myoview. The echo has shown progression of AS - I cancelled the Myoview in light of patient most likely needing cardiac catheterization.   The patient does not have symptoms concerning for COVID-19 infection (fever, chills, cough, or new shortness of breath).   Comes in today. Here with her daughter. She admits she is quite anxious. She continues to note a chest sensation with deep breathing. She feels like she is more short of breath. She is not dizzy or lightheaded. She notes she is having more issues with her ears - ?sinuses. She has no exertional symptoms. Notes lots of reflux/GERD.   Past Medical History:  Diagnosis Date  . Anxiety   . GERD (gastroesophageal reflux disease)   . Hiatal hernia   . Insomnia   . Osteopenia   . Right knee DJD   . Vulvar cancer [facility])     Past Surgical  History:  Procedure Laterality Date  . COSMETIC SURGERY    . TONSILLECTOMY AND ADENOIDECTOMY    . TUBAL LIGATION    . VULVA SURGERY       Medications: Current Meds  Medication Sig  . celecoxib (CELEBREX) 200 MG capsule Take 200 mg by mouth daily.   . cetirizine (ZYRTEC) 10 MG tablet Take 10 mg by mouth daily.  . Cholecalciferol (VITAMIN D PO) Take 1 capsule by mouth daily.  Marland Kitchen escitalopram (LEXAPRO) 10 MG tablet Take 10 mg by mouth daily.   Marland Kitchen esomeprazole (NEXIUM) 40 MG capsule TAKE  (1)  CAPSULE  TWICE DAILY (TAKE ON AN EMPTY STOMACH AT LEAST 30MIN- UTES BEFORE MEALS).  . ferrous sulfate 325 (65 FE) MG tablet Take 325 mg by mouth daily with breakfast.  . metoprolol succinate (TOPROL-XL) 50 MG 24 hr tablet Take 25 mg by mouth daily.   . Misc Natural Products (OSTEO BI-FLEX JOINT SHIELD PO) Take 1 tablet by mouth daily.  . Multiple Vitamin (MULTIVITAMIN) capsule Take 1 capsule by mouth daily.  . Travoprost, BAK Free, (TRAVATAN) 0.004 % SOLN ophthalmic solution   . tretinoin (RETIN-A) 0.025 % cream Apply topically.     Allergies: Allergies  Allergen Reactions  . Dorzolamide Hcl-Timolol Mal Shortness Of Breath  . Novocain [Procaine] Shortness  Of Breath and Other (See Comments)    Social History: The patient  reports that she has quit smoking. Her smoking use included cigarettes. She has a 30.00 pack-year smoking history. She has never used smokeless tobacco. She reports that she does not drink alcohol or use drugs.   Family History: The patient's family history includes CAD (age of onset: 54) in her brother; CAD (age of onset: 47) in her father.   Review of Systems: Please see the history of present illness.   All other systems are reviewed and negative.   Physical Exam: VS:  BP 116/62   Pulse 70   Ht 5\' 6"  (1.676 m)   Wt 209 lb 12.8 oz (95.2 kg)   SpO2 97%   BMI 33.86 kg/m  .  BMI Body mass index is 33.86 kg/m.  Wt Readings from Last 3 Encounters:  10/21/19 209 lb  12.8 oz (95.2 kg)  10/02/19 209 lb 12.8 oz (95.2 kg)  06/18/18 195 lb 3.2 oz (88.5 kg)    General: Pleasant. Alert and in no acute distress.  She is anxious.  Cardiac: Regular rate and rhythm. Harsh outflow murmur noted. No edema.  Respiratory:  Lungs are clear to auscultation bilaterally with normal work of breathing.  GI: Soft and nontender.  MS: No deformity or atrophy. Gait and ROM intact.  Skin: Warm and dry. Color is normal.  Neuro:  Strength and sensation are intact and no gross focal deficits noted.  Psych: Alert, appropriate and with normal affect.   LABORATORY DATA:  EKG:  EKG is ordered today. This shows NSR. HR is 70.   Lab Results  Component Value Date   WBC 6.6 10/02/2019   HGB 13.3 10/02/2019   HCT 38.8 10/02/2019   PLT 246 10/02/2019   GLUCOSE 111 (H) 10/02/2019   CHOL 221 (A) 06/01/2016   TRIG 123 06/01/2016   HDL 80 (A) 06/01/2016   LDLDIRECT 133.4 12/29/2009   LDLCALC 116 06/01/2016   ALT 15 06/01/2016   AST 10 (A) 06/01/2016   NA 140 10/02/2019   K 4.4 10/02/2019   CL 101 10/02/2019   CREATININE 0.87 10/02/2019   BUN 17 10/02/2019   CO2 23 10/02/2019   TSH 2.04 06/01/2016     BNP (last 3 results) No results for input(s): BNP in the last 8760 hours.  ProBNP (last 3 results) No results for input(s): PROBNP in the last 8760 hours.   Other Studies Reviewed Today:  ECHO IMPRESSIONS 09/2019  1. Left ventricular ejection fraction, by estimation, is 60 to 65%. The  left ventricle has normal function. The left ventricle has no regional  wall motion abnormalities. There is moderate left ventricular hypertrophy.  Left ventricular diastolic  parameters are consistent with Grade I diastolic dysfunction (impaired  relaxation).  2. Right ventricular systolic function is normal. The right ventricular  size is normal. There is normal pulmonary artery systolic pressure. The  estimated right ventricular systolic pressure is Q000111Q mmHg.  3. Left atrial  size was mildly dilated.  4. The mitral valve is normal in structure and function. No evidence of  mitral valve regurgitation. No evidence of mitral stenosis.  5. The aortic valve is tricuspid. Aortic valve regurgitation is trivial.  Severe aortic valve stenosis. Aortic valve area, by VTI measures 0.62 cm.  Aortic valve mean gradient measures 38.0 mmHg.  6. Aortic dilatation noted. There is mild dilatation of the ascending  aorta.  7. The inferior vena cava is normal in size with  greater than 50%  respiratory variability, suggesting right atrial pressure of 3 mmHg.   ECHO IMPRESSIONS 01/2019  1. The left ventricle has low normal systolic function, with an ejection  fraction of 50-55%. The cavity size was normal. There is mildly increased  left ventricular wall thickness. Left ventricular diastolic Doppler  parameters are consistent with  pseudonormalization. Elevated mean left atrial pressure.  2. The right ventricle has normal systolic function. The cavity was  normal.  3. The mitral valve is grossly normal. There is mild mitral annular  calcification present.  4. The tricuspid valve is grossly normal.  5. The aortic valve is tricuspid. Severe calcifcation of the aortic  valve. Aortic valve regurgitation is trivial by color flow Doppler.  Moderate-severe stenosis of the aortic valve.  6. Normal LV systolic function; mild LVH; moderate diastolic dysfunction;  calcified aortic valve with moderate to severe AS (mean gradient 37 mmHg);  trace AI.    MyoviewStudy Highlights3/2018   The left ventricular ejection fraction is hyperdynamic (>65%).  Nuclear stress EF: 67%.  There was no ST segment deviation noted during stress.  The study is normal.  This is a low risk study.     ASSESSMENT &PLAN:    1.Progressive AS - she remains with shortness of breath and atypical chest pain - ?how much of this is related to the severity of her valve. We are proceeding  on with left/right heart catheterization - The patient understands that risks include but are not limited to stroke (1 in 1000), death (1 in 1000), kidney failure [usually temporary] (1 in 500), bleeding (1 in 200), allergic reaction [possibly serious] (1 in 200), and agrees to proceed. Will hold Celebrex. She is to take baby aspirin that morning.   COVID test this Friday. Cardiac cath for next Tuesday with Dr. Tamala Julian at 7:30AM.   If in fact has severe AS - will hope to arrange TAVR formal evaluation.   2. Chronic DOE - felt to be multifactorial given her weight but now with progressive AS. See above.   3. Prior spells of shoulder/jaw pain - not really endorsed today - does feel chest tightness with deep breathing.   4. Obesity  5. GERD  6. Probable untreated OSA  7. COVID-19 Education: The signs and symptoms of COVID-19 were discussed with the patient and how to seek care for testing (follow up with PCP or arrange E-visit).  The importance of social distancing, staying at home, hand hygiene and wearing a mask when out in public were discussed today.  Current medicines are reviewed with the patient today.  The patient does not have concerns regarding medicines other than what has been noted above.  The following changes have been made:  See above.  Labs/ tests ordered today include:    Orders Placed This Encounter  Procedures  . Comprehensive metabolic panel  . CBC  . EKG 12-Lead     Disposition:   Further disposition pending.   Patient is agreeable to this plan and will call if any problems develop in the interim.   SignedTruitt Merle, NP  10/21/2019 2:55 PM  [place] [address] [place], [place]  [address] Phone: [phone] Fax: [phone]

## 2019-10-17 NOTE — Progress Notes (Signed)
CARDIOLOGY OFFICE NOTE  Date:  10/21/2019    Mackenzie Jensen Date of Birth: [birth year] Medical Record [id number]  PCP:  Moshe Cipro, MD  Cardiologist:  [facility]  Chief Complaint  Patient presents with  . Follow-up    Follow up after echo. Seen for Dr. Rayann Heman    History of Present Illness: Mackenzie Jensen is a 86 y.o. female who presents today for a pre cath/follow up visit after echo.  Seen for Dr. Rayann Heman.   She has a history of shortness of breath that was evaluated in the past with Myoview, echo and PFTS - previously improved with weight loss. Also with moderate AS and untreated OSA along with obesity. Last seen by Dr. Rayann Heman in October of 2019 - was felt to be stable. AS felt to be moderate at that time. Toprol was started.  I then did a telehealth visit with her back in April - felt to be doing ok. Follow up echo had been postponed due to [place] and was done later last summer. Weight was back up due to the pandemic. Overall felt to be doing ok.   She called earlier this month with atypical pain and ongoing SOB. I then saw her in the office - arranged follow up echo and Myoview. The echo has shown progression of AS - I cancelled the Myoview in light of patient most likely needing cardiac catheterization.   The patient does not have symptoms concerning for COVID-19 infection (fever, chills, cough, or new shortness of breath).   Comes in today. Here with her daughter. She admits she is quite anxious. She continues to note a chest sensation with deep breathing. She feels like she is more short of breath. She is not dizzy or lightheaded. She notes she is having more issues with her ears - ?sinuses. She has no exertional symptoms. Notes lots of reflux/GERD.   Past Medical History:  Diagnosis Date  . Anxiety   . GERD (gastroesophageal reflux disease)   . Hiatal hernia   . Insomnia   . Osteopenia   . Right knee DJD   . Vulvar cancer [facility])     Past Surgical  History:  Procedure Laterality Date  . COSMETIC SURGERY    . TONSILLECTOMY AND ADENOIDECTOMY    . TUBAL LIGATION    . VULVA SURGERY       Medications: Current Meds  Medication Sig  . celecoxib (CELEBREX) 200 MG capsule Take 200 mg by mouth daily.   . cetirizine (ZYRTEC) 10 MG tablet Take 10 mg by mouth daily.  . Cholecalciferol (VITAMIN D PO) Take 1 capsule by mouth daily.  Marland Kitchen escitalopram (LEXAPRO) 10 MG tablet Take 10 mg by mouth daily.   Marland Kitchen esomeprazole (NEXIUM) 40 MG capsule TAKE  (1)  CAPSULE  TWICE DAILY (TAKE ON AN EMPTY STOMACH AT LEAST 30MIN- UTES BEFORE MEALS).  . ferrous sulfate 325 (65 FE) MG tablet Take 325 mg by mouth daily with breakfast.  . metoprolol succinate (TOPROL-XL) 50 MG 24 hr tablet Take 25 mg by mouth daily.   . Misc Natural Products (OSTEO BI-FLEX JOINT SHIELD PO) Take 1 tablet by mouth daily.  . Multiple Vitamin (MULTIVITAMIN) capsule Take 1 capsule by mouth daily.  . Travoprost, BAK Free, (TRAVATAN) 0.004 % SOLN ophthalmic solution   . tretinoin (RETIN-A) 0.025 % cream Apply topically.     Allergies: Allergies  Allergen Reactions  . Dorzolamide Hcl-Timolol Mal Shortness Of Breath  . Novocain [Procaine] Shortness  Of Breath and Other (See Comments)    Social History: The patient  reports that she has quit smoking. Her smoking use included cigarettes. She has a 30.00 pack-year smoking history. She has never used smokeless tobacco. She reports that she does not drink alcohol or use drugs.   Family History: The patient's family history includes CAD (age of onset: 28) in her brother; CAD (age of onset: 43) in her father.   Review of Systems: Please see the history of present illness.   All other systems are reviewed and negative.   Physical Exam: VS:  BP 116/62   Pulse 70   Ht 5\' 6"  (1.676 m)   Wt 209 lb 12.8 oz (95.2 kg)   SpO2 97%   BMI 33.86 kg/m  .  BMI Body mass index is 33.86 kg/m.  Wt Readings from Last 3 Encounters:  10/21/19 209 lb  12.8 oz (95.2 kg)  10/02/19 209 lb 12.8 oz (95.2 kg)  06/18/18 195 lb 3.2 oz (88.5 kg)    General: Pleasant. Alert and in no acute distress.  She is anxious.  Cardiac: Regular rate and rhythm. Harsh outflow murmur noted. No edema.  Respiratory:  Lungs are clear to auscultation bilaterally with normal work of breathing.  GI: Soft and nontender.  MS: No deformity or atrophy. Gait and ROM intact.  Skin: Warm and dry. Color is normal.  Neuro:  Strength and sensation are intact and no gross focal deficits noted.  Psych: Alert, appropriate and with normal affect.   LABORATORY DATA:  EKG:  EKG is ordered today. This shows NSR. HR is 70.   Lab Results  Component Value Date   WBC 6.6 10/02/2019   HGB 13.3 10/02/2019   HCT 38.8 10/02/2019   PLT 246 10/02/2019   GLUCOSE 111 (H) 10/02/2019   CHOL 221 (A) 06/01/2016   TRIG 123 06/01/2016   HDL 80 (A) 06/01/2016   LDLDIRECT 133.4 12/29/2009   LDLCALC 116 06/01/2016   ALT 15 06/01/2016   AST 10 (A) 06/01/2016   NA 140 10/02/2019   K 4.4 10/02/2019   CL 101 10/02/2019   CREATININE 0.87 10/02/2019   BUN 17 10/02/2019   CO2 23 10/02/2019   TSH 2.04 06/01/2016     BNP (last 3 results) No results for input(s): BNP in the last 8760 hours.  ProBNP (last 3 results) No results for input(s): PROBNP in the last 8760 hours.   Other Studies Reviewed Today:  ECHO IMPRESSIONS 09/2019  1. Left ventricular ejection fraction, by estimation, is 60 to 65%. The  left ventricle has normal function. The left ventricle has no regional  wall motion abnormalities. There is moderate left ventricular hypertrophy.  Left ventricular diastolic  parameters are consistent with Grade I diastolic dysfunction (impaired  relaxation).  2. Right ventricular systolic function is normal. The right ventricular  size is normal. There is normal pulmonary artery systolic pressure. The  estimated right ventricular systolic pressure is Q000111Q mmHg.  3. Left atrial  size was mildly dilated.  4. The mitral valve is normal in structure and function. No evidence of  mitral valve regurgitation. No evidence of mitral stenosis.  5. The aortic valve is tricuspid. Aortic valve regurgitation is trivial.  Severe aortic valve stenosis. Aortic valve area, by VTI measures 0.62 cm.  Aortic valve mean gradient measures 38.0 mmHg.  6. Aortic dilatation noted. There is mild dilatation of the ascending  aorta.  7. The inferior vena cava is normal in size with  greater than 50%  respiratory variability, suggesting right atrial pressure of 3 mmHg.   ECHO IMPRESSIONS 01/2019  1. The left ventricle has low normal systolic function, with an ejection  fraction of 50-55%. The cavity size was normal. There is mildly increased  left ventricular wall thickness. Left ventricular diastolic Doppler  parameters are consistent with  pseudonormalization. Elevated mean left atrial pressure.  2. The right ventricle has normal systolic function. The cavity was  normal.  3. The mitral valve is grossly normal. There is mild mitral annular  calcification present.  4. The tricuspid valve is grossly normal.  5. The aortic valve is tricuspid. Severe calcifcation of the aortic  valve. Aortic valve regurgitation is trivial by color flow Doppler.  Moderate-severe stenosis of the aortic valve.  6. Normal LV systolic function; mild LVH; moderate diastolic dysfunction;  calcified aortic valve with moderate to severe AS (mean gradient 37 mmHg);  trace AI.    MyoviewStudy Highlights3/2018   The left ventricular ejection fraction is hyperdynamic (>65%).  Nuclear stress EF: 67%.  There was no ST segment deviation noted during stress.  The study is normal.  This is a low risk study.     ASSESSMENT &PLAN:    1.Progressive AS - she remains with shortness of breath and atypical chest pain - ?how much of this is related to the severity of her valve. We are proceeding  on with left/right heart catheterization - The patient understands that risks include but are not limited to stroke (1 in 1000), death (1 in 1000), kidney failure [usually temporary] (1 in 500), bleeding (1 in 200), allergic reaction [possibly serious] (1 in 200), and agrees to proceed. Will hold Celebrex. She is to take baby aspirin that morning.   COVID test this Friday. Cardiac cath for next Tuesday with Dr. Tamala Julian at 7:30AM.   If in fact has severe AS - will hope to arrange TAVR formal evaluation.   2. Chronic DOE - felt to be multifactorial given her weight but now with progressive AS. See above.   3. Prior spells of shoulder/jaw pain - not really endorsed today - does feel chest tightness with deep breathing.   4. Obesity  5. GERD  6. Probable untreated OSA  7. COVID-19 Education: The signs and symptoms of COVID-19 were discussed with the patient and how to seek care for testing (follow up with PCP or arrange E-visit).  The importance of social distancing, staying at home, hand hygiene and wearing a mask when out in public were discussed today.  Current medicines are reviewed with the patient today.  The patient does not have concerns regarding medicines other than what has been noted above.  The following changes have been made:  See above.  Labs/ tests ordered today include:    Orders Placed This Encounter  Procedures  . Comprehensive metabolic panel  . CBC  . EKG 12-Lead     Disposition:   Further disposition pending.   Patient is agreeable to this plan and will call if any problems develop in the interim.   SignedTruitt Merle, NP  10/21/2019 2:55 PM  [place] [address] [place], [place]  [address] Phone: [phone] Fax: [phone]

## 2019-10-18 ENCOUNTER — Encounter (HOSPITAL_COMMUNITY): Payer: Medicare PPO

## 2019-10-21 ENCOUNTER — Other Ambulatory Visit: Payer: Self-pay

## 2019-10-21 ENCOUNTER — Ambulatory Visit: Payer: Medicare PPO | Admitting: Nurse Practitioner

## 2019-10-21 ENCOUNTER — Encounter: Payer: Self-pay | Admitting: Nurse Practitioner

## 2019-10-21 VITALS — BP 116/62 | HR 70 | Ht 66.0 in | Wt 209.8 lb

## 2019-10-21 DIAGNOSIS — R0602 Shortness of breath: Secondary | ICD-10-CM | POA: Diagnosis not present

## 2019-10-21 DIAGNOSIS — Z7189 Other specified counseling: Secondary | ICD-10-CM

## 2019-10-21 DIAGNOSIS — I350 Nonrheumatic aortic (valve) stenosis: Secondary | ICD-10-CM | POA: Diagnosis not present

## 2019-10-21 DIAGNOSIS — R079 Chest pain, unspecified: Secondary | ICD-10-CM

## 2019-10-21 NOTE — Patient Instructions (Addendum)
After Visit Summary:  We will be checking the following labs today - CMET & CBC   Medication Instructions:    Continue with your current medicines.    If you need a refill on your cardiac medications before your next appointment, please call your pharmacy.     Testing/Procedures To Be Arranged:  Cardiac catheterization  Follow-Up:   We will see what your catheterization shows and then decide about your follow up.     At [facility], you and your health needs are our priority.  As part of our continuing mission to provide you with exceptional heart care, we have created designated Provider Care Teams.  These Care Teams include your primary Cardiologist (physician) and Advanced Practice Providers (APPs -  Physician Assistants and Nurse Practitioners) who all work together to provide you with the care you need, when you need it.  Special Instructions:  . Stay safe, stay home, wash your hands for at least 20 seconds and wear a mask when out in public.  . It was good to talk with you today.    Your provider has recommended a cardiac catherization.   COVID SCREENING INFORMATION: You are scheduled for your COVID screening on ________________________ at ______________________. [place] [facility] ([facility]) [address] Stay in the RIGHT lane and proceed under the brick awning (NOT the tent) and tell them you are there for pre-procedure testing. Do NOT bring any pets with you to the testing site  You are scheduled for a cardiac catheterization on Tuesday, March 2nd at 7:30 AM with Dr. Tamala Julian or associate.  Please arrive at the [facility]Main Entrance) at [facility] at [place] on Tuesday, March 2nd at [place] parking is available.   You are allowed only one visitor in the hospital with you. Both you and your visitor must wear masks.    Special note: Every effort is made to have your  procedure done on time.   Please understand that emergencies sometimes delay a scheduled   procedure.  No food or drink after midnight next Monday night.  You may have clear liquids until 5 am on the day of your procedure.   You may take your morning medications with a sip of water on the day of your procedure.  Please take a baby aspirin (81 mg) on the morning of your procedure.   Medications to HOLD - Celebrex  Plan for a one night stay -- bring personal belongings.  Bring a current list of your medications and current insurance cards.  You MUST have a responsible person to drive you home. Someone MUST be with you the first 24 hours after you arrive home or your discharge will be delayed. Wear clothes that are easy to get on and off and wear slip on shoes.    Coronary Angiogram A coronary angiogram, also called coronary angiography, is an X-ray procedure used to look at the arteries in the heart. In this procedure, a dye (contrast dye) is injected through a long, hollow tube (catheter). The catheter is about the size of a piece of cooked spaghetti and is inserted through your groin, wrist, or arm. The dye is injected into each artery, and X-rays are then taken to show if there is a blockage in the arteries of your heart.  LET [facility] CARE PROVIDER KNOW ABOUT:  Any allergies you have, including allergies to shellfish or  contrast dye.    All medicines you are taking, including vitamins, herbs, eye drops, creams, and over-the-counter medicines.    Previous problems you or members of your family have had with the use of anesthetics.    Any blood disorders you have.    Previous surgeries you have had.  History of kidney problems or failure.    Other medical conditions you have.  RISKS AND COMPLICATIONS  Generally, a coronary angiogram is a safe procedure. However, about 1 person out of 1000 can have problems that may include:  Allergic reaction to the  dye.  Bleeding/bruising from the access site or other locations.  Kidney injury, especially in people with impaired kidney function.   Stroke (rare).  Heart attack (rare).  Irregular rhythms (rare)  Death (rare)  BEFORE THE PROCEDURE   Do not eat or drink anything after midnight the night before the procedure or as directed by your health care provider.    Ask your health care provider about changing or stopping your regular medicines. This is especially important if you are taking diabetes medicines or blood thinners.  PROCEDURE  You may be given a medicine to help you relax (sedative) before the procedure. This medicine is given through an intravenous (IV) access tube that is inserted into one of your veins.    The area where the catheter will be inserted will be washed and shaved. This is usually done in the groin but may be done in the fold of your arm (near your elbow) or in the wrist.     A medicine will be given to numb the area where the catheter will be inserted (local anesthetic).    The health care provider will insert the catheter into an artery. The catheter will be guided by using a special type of X-ray (fluoroscopy) of the blood vessel being examined.    A special dye will then be injected into the catheter, and X-rays will be taken. The dye will help to show where any narrowing or blockages are located in the heart arteries.     AFTER THE PROCEDURE   If the procedure is done through the leg, you will be kept in bed lying flat for several hours. You will be instructed to not bend or cross your legs.  The insertion site will be checked frequently.    The pulse in your feet or wrist will be checked frequently.    Additional blood tests, X-rays, and an electrocardiogram may be done.       Call the [place] office at [phone] if you have any questions, problems or concerns.

## 2019-10-22 ENCOUNTER — Telehealth: Payer: Self-pay | Admitting: Internal Medicine

## 2019-10-22 LAB — COMPREHENSIVE METABOLIC PANEL
ALT: 7 IU/L (ref 0–32)
AST: 13 IU/L (ref 0–40)
Albumin/Globulin Ratio: 1.4 (ref 1.2–2.2)
Albumin: 3.7 g/dL (ref 3.6–4.6)
Alkaline Phosphatase: 47 IU/L (ref 39–117)
BUN/Creatinine Ratio: 19 (ref 12–28)
BUN: 14 mg/dL (ref 8–27)
Bilirubin Total: 0.5 mg/dL (ref 0.0–1.2)
CO2: 25 mmol/L (ref 20–29)
Calcium: 9.1 mg/dL (ref 8.7–10.3)
Chloride: 99 mmol/L (ref 96–106)
Creatinine, Ser: 0.74 mg/dL (ref 0.57–1.00)
GFR calc Af Amer: 87 mL/min/{1.73_m2} (ref >59)
GFR calc non Af Amer: 75 mL/min/{1.73_m2} (ref >59)
Globulin, Total: 2.7 g/dL (ref 1.5–4.5)
Glucose: 101 mg/dL — ABNORMAL HIGH (ref 65–99)
Potassium: 4.2 mmol/L (ref 3.5–5.2)
Sodium: 137 mmol/L (ref 134–144)
Total Protein: 6.4 g/dL (ref 6.0–8.5)

## 2019-10-22 LAB — CBC
Hematocrit: 36.4 % (ref 34.0–46.6)
Hemoglobin: 12.7 g/dL (ref 11.1–15.9)
MCH: 31.9 pg (ref 26.6–33.0)
MCHC: 34.9 g/dL (ref 31.5–35.7)
MCV: 92 fL (ref 79–97)
Platelets: 203 10*3/uL (ref 150–450)
RBC: 3.98 x10E6/uL (ref 3.77–5.28)
RDW: 12.8 % (ref 11.7–15.4)
WBC: 9.5 10*3/uL (ref 3.4–10.8)

## 2019-10-22 NOTE — Telephone Encounter (Signed)
I would assume she is asking about the TAVR team - this is Dr. Burt Knack and Dr. Angelena Form who are interventional cardiologists with our group.   Mackenzie Jensen

## 2019-10-22 NOTE — Telephone Encounter (Signed)
lvm with Dr. Angelena Form and Dr. Burt Knack name of the cardiologist that do TVAR.

## 2019-10-22 NOTE — Telephone Encounter (Signed)
New Message   Pt's daughter called and wanted to speak with PA Truitt Merle, she said she has some questions regarding the Pt's visit yesterday 10/21/19.  Please call

## 2019-10-22 NOTE — Telephone Encounter (Signed)
S/w Pt's daughter per  [facility])  wanted to know the two heart surgeons Cecille Rubin mentioned yesterday at [facility].  Gave her the name of TCTS to look up.  Daughter wanted to know how long heart cath possibly took since daughter had a appt across the street at 10:30.  Could not give a definitive answer. Will send to [place] to [place].

## 2019-10-25 ENCOUNTER — Other Ambulatory Visit (HOSPITAL_COMMUNITY)
Admission: RE | Admit: 2019-10-25 | Discharge: 2019-10-25 | Disposition: A | Discharge status: Home / Self Care | Payer: Medicare PPO | Source: Ambulatory Visit | Attending: Interventional Cardiology | Admitting: Interventional Cardiology

## 2019-10-25 DIAGNOSIS — Z20822 Contact with and (suspected) exposure to covid-19: Secondary | ICD-10-CM | POA: Insufficient documentation

## 2019-10-25 DIAGNOSIS — Z01812 Encounter for preprocedural laboratory examination: Secondary | ICD-10-CM | POA: Diagnosis present

## 2019-10-25 LAB — SARS CORONAVIRUS 2 (TAT 6-24 HRS): SARS Coronavirus 2: NEGATIVE

## 2019-10-28 ENCOUNTER — Telehealth: Payer: Self-pay | Admitting: *Deleted

## 2019-10-28 DIAGNOSIS — I350 Nonrheumatic aortic (valve) stenosis: Secondary | ICD-10-CM

## 2019-10-28 NOTE — Telephone Encounter (Signed)
Pt contacted pre-catheterization scheduled at [facility] for: Tuesday October 29, 2019 7:30 AM Verified arrival time and place: [place] [facility]) at: 5:30 AM   No solid food after midnight prior to cath, clear liquids until 5 AM day of procedure. Contrast allergy: no   AM meds can be  taken pre-cath with sip of water including: ASA 81 mg   Confirmed patient has responsible adult to drive home post procedure and observe 24 hours after arriving home: yes  Currently, due to Covid-19 pandemic, only one person will be allowed with patient. Must be the same person for patient's entire stay and will be required to wear a mask. They will be asked to wait in the waiting room for the duration of the patient's stay.  Patients are required to wear a mask when they enter the hospital.      COVID-19 Pre-Screening Questions:  . In the past 7 to 10 days have you had a cough,  shortness of breath, headache, congestion, fever (100 or greater) body aches, chills, sore throat, or sudden loss of taste or sense of smell? Shortness of breath, not new -headaches  . Have you been around anyone with known Covid 19 in the past 7-10 days? no . Have you been around anyone who is awaiting Covid 19 test results in the past 7 to 10 days? no . Have you been around anyone who has been exposed to Covid 19, or has mentioned symptoms of Covid 19 within the past 7 to 10 days? No   I reviewed procedure/mask/visitor instructions, COVID-19 screening questions with patient, she verbalized understanding, thanked me for call.

## 2019-10-29 ENCOUNTER — Ambulatory Visit (HOSPITAL_COMMUNITY)
Admission: RE | Admit: 2019-10-29 | Discharge: 2019-10-29 | Disposition: A | Discharge status: Home / Self Care | Payer: Medicare PPO | Attending: Interventional Cardiology | Admitting: Interventional Cardiology

## 2019-10-29 ENCOUNTER — Encounter (HOSPITAL_COMMUNITY)
Admission: RE | Disposition: A | Discharge status: Home / Self Care | Payer: Self-pay | Source: Home / Self Care | Attending: Interventional Cardiology

## 2019-10-29 ENCOUNTER — Other Ambulatory Visit: Payer: Self-pay

## 2019-10-29 DIAGNOSIS — G4733 Obstructive sleep apnea (adult) (pediatric): Secondary | ICD-10-CM | POA: Diagnosis not present

## 2019-10-29 DIAGNOSIS — Z888 Allergy status to other drugs, medicaments and biological substances status: Secondary | ICD-10-CM | POA: Diagnosis not present

## 2019-10-29 DIAGNOSIS — Z6833 Body mass index (BMI) 33.0-33.9, adult: Secondary | ICD-10-CM | POA: Diagnosis not present

## 2019-10-29 DIAGNOSIS — G4700 Insomnia, unspecified: Secondary | ICD-10-CM | POA: Diagnosis not present

## 2019-10-29 DIAGNOSIS — I2510 Atherosclerotic heart disease of native coronary artery without angina pectoris: Secondary | ICD-10-CM | POA: Diagnosis not present

## 2019-10-29 DIAGNOSIS — R0609 Other forms of dyspnea: Secondary | ICD-10-CM | POA: Insufficient documentation

## 2019-10-29 DIAGNOSIS — Z87891 Personal history of nicotine dependence: Secondary | ICD-10-CM | POA: Insufficient documentation

## 2019-10-29 DIAGNOSIS — E669 Obesity, unspecified: Secondary | ICD-10-CM | POA: Insufficient documentation

## 2019-10-29 DIAGNOSIS — M8580 Other specified disorders of bone density and structure, unspecified site: Secondary | ICD-10-CM | POA: Insufficient documentation

## 2019-10-29 DIAGNOSIS — K219 Gastro-esophageal reflux disease without esophagitis: Secondary | ICD-10-CM | POA: Diagnosis not present

## 2019-10-29 DIAGNOSIS — Z8249 Family history of ischemic heart disease and other diseases of the circulatory system: Secondary | ICD-10-CM | POA: Insufficient documentation

## 2019-10-29 DIAGNOSIS — I350 Nonrheumatic aortic (valve) stenosis: Secondary | ICD-10-CM

## 2019-10-29 DIAGNOSIS — E782 Mixed hyperlipidemia: Secondary | ICD-10-CM | POA: Diagnosis present

## 2019-10-29 DIAGNOSIS — Z79899 Other long term (current) drug therapy: Secondary | ICD-10-CM | POA: Insufficient documentation

## 2019-10-29 DIAGNOSIS — R079 Chest pain, unspecified: Secondary | ICD-10-CM

## 2019-10-29 DIAGNOSIS — E663 Overweight: Secondary | ICD-10-CM | POA: Diagnosis present

## 2019-10-29 HISTORY — PX: RIGHT/LEFT HEART CATH AND CORONARY ANGIOGRAPHY: CATH118266

## 2019-10-29 LAB — POCT I-STAT 7, (LYTES, BLD GAS, ICA,H+H)
Acid-Base Excess: 2 mmol/L (ref 0.0–2.0)
Bicarbonate: 28.4 mmol/L — ABNORMAL HIGH (ref 20.0–28.0)
Calcium, Ion: 1.16 mmol/L (ref 1.15–1.40)
HCT: 36 % (ref 36.0–46.0)
Hemoglobin: 12.2 g/dL (ref 12.0–15.0)
O2 Saturation: 99 %
Potassium: 3.5 mmol/L (ref 3.5–5.1)
Sodium: 134 mmol/L — ABNORMAL LOW (ref 135–145)
TCO2: 30 mmol/L (ref 22–32)
pCO2 arterial: 50.3 mmHg — ABNORMAL HIGH (ref 32.0–48.0)
pH, Arterial: 7.36 (ref 7.350–7.450)
pO2, Arterial: 164 mmHg — ABNORMAL HIGH (ref 83.0–108.0)

## 2019-10-29 LAB — POCT I-STAT EG7
Acid-Base Excess: 3 mmol/L — ABNORMAL HIGH (ref 0.0–2.0)
Bicarbonate: 30.2 mmol/L — ABNORMAL HIGH (ref 20.0–28.0)
Calcium, Ion: 1.19 mmol/L (ref 1.15–1.40)
HCT: 37 % (ref 36.0–46.0)
Hemoglobin: 12.6 g/dL (ref 12.0–15.0)
O2 Saturation: 77 %
Potassium: 3.5 mmol/L (ref 3.5–5.1)
Sodium: 137 mmol/L (ref 135–145)
TCO2: 32 mmol/L (ref 22–32)
pCO2, Ven: 55.3 mmHg (ref 44.0–60.0)
pH, Ven: 7.345 (ref 7.250–7.430)
pO2, Ven: 44 mmHg (ref 32.0–45.0)

## 2019-10-29 SURGERY — RIGHT/LEFT HEART CATH AND CORONARY ANGIOGRAPHY
Anesthesia: LOCAL

## 2019-10-29 MED ORDER — DIAZEPAM 5 MG PO TABS: 5.0000 mg | ORAL_TABLET | ORAL | Status: DC

## 2019-10-29 MED ORDER — HEPARIN SODIUM (PORCINE) 1000 UNIT/ML IJ SOLN
INTRAMUSCULAR | Status: AC
  Filled 2019-10-29: qty 1

## 2019-10-29 MED ORDER — HEPARIN (PORCINE) IN NACL 1000-0.9 UT/500ML-% IV SOLN
INTRAVENOUS | Status: DC | PRN
  Administered 2019-10-29: 500 mL

## 2019-10-29 MED ORDER — HEPARIN SODIUM (PORCINE) 1000 UNIT/ML IJ SOLN
INTRAMUSCULAR | Status: DC | PRN
  Administered 2019-10-29: 4500 [IU] via INTRAVENOUS

## 2019-10-29 MED ORDER — HEPARIN (PORCINE) IN NACL 1000-0.9 UT/500ML-% IV SOLN
INTRAVENOUS | Status: AC
  Filled 2019-10-29: qty 500

## 2019-10-29 MED ORDER — SODIUM CHLORIDE 0.9% FLUSH: 3.0000 mL | Freq: Two times a day (BID) | INTRAVENOUS | Status: DC

## 2019-10-29 MED ORDER — LIDOCAINE HCL (PF) 1 % IJ SOLN
INTRAMUSCULAR | Status: AC
  Filled 2019-10-29: qty 30

## 2019-10-29 MED ORDER — IOHEXOL 350 MG/ML SOLN
INTRAVENOUS | Status: DC | PRN
  Administered 2019-10-29: 50 mL

## 2019-10-29 MED ORDER — VERAPAMIL HCL 2.5 MG/ML IV SOLN
INTRAVENOUS | Status: AC
  Filled 2019-10-29: qty 2

## 2019-10-29 MED ORDER — FENTANYL CITRATE (PF) 100 MCG/2ML IJ SOLN
INTRAMUSCULAR | Status: DC | PRN
  Administered 2019-10-29 (×2): 25 ug via INTRAVENOUS

## 2019-10-29 MED ORDER — ACETAMINOPHEN 325 MG PO TABS: 650.0000 mg | ORAL_TABLET | ORAL | Status: DC | PRN

## 2019-10-29 MED ORDER — SODIUM CHLORIDE 0.9 % IV SOLN: 250.0000 mL | INTRAVENOUS | Status: DC | PRN

## 2019-10-29 MED ORDER — SODIUM CHLORIDE 0.9 % IV SOLN: INTRAVENOUS | Status: DC

## 2019-10-29 MED ORDER — HEPARIN (PORCINE) IN NACL 1000-0.9 UT/500ML-% IV SOLN
INTRAVENOUS | Status: DC | PRN
  Administered 2019-10-29 (×2): 500 mL

## 2019-10-29 MED ORDER — SODIUM CHLORIDE 0.9% FLUSH: 3.0000 mL | INTRAVENOUS | Status: DC | PRN

## 2019-10-29 MED ORDER — SODIUM CHLORIDE 0.9 % IV SOLN
INTRAVENOUS | Status: AC | PRN
  Administered 2019-10-29: 50 mL/h via INTRAVENOUS

## 2019-10-29 MED ORDER — MIDAZOLAM HCL 2 MG/2ML IJ SOLN
INTRAMUSCULAR | Status: AC
  Filled 2019-10-29: qty 2

## 2019-10-29 MED ORDER — SODIUM CHLORIDE 0.9 % IV SOLN: Freq: Once | INTRAVENOUS | Status: DC

## 2019-10-29 MED ORDER — LABETALOL HCL 5 MG/ML IV SOLN: 10.0000 mg | INTRAVENOUS | Status: DC | PRN

## 2019-10-29 MED ORDER — MIDAZOLAM HCL 2 MG/2ML IJ SOLN
INTRAMUSCULAR | Status: DC | PRN
  Administered 2019-10-29 (×2): 1 mg via INTRAVENOUS

## 2019-10-29 MED ORDER — ASPIRIN 81 MG PO CHEW: 81.0000 mg | CHEWABLE_TABLET | ORAL | Status: DC

## 2019-10-29 MED ORDER — HYDRALAZINE HCL 20 MG/ML IJ SOLN: 10.0000 mg | INTRAMUSCULAR | Status: DC | PRN

## 2019-10-29 MED ORDER — VERAPAMIL HCL 2.5 MG/ML IV SOLN
INTRAVENOUS | Status: DC | PRN
  Administered 2019-10-29: 08:00:00 10 mL via INTRA_ARTERIAL

## 2019-10-29 MED ORDER — LIDOCAINE HCL (PF) 1 % IJ SOLN
INTRAMUSCULAR | Status: DC | PRN
  Administered 2019-10-29 (×2): 2 mL

## 2019-10-29 MED ORDER — ONDANSETRON HCL 4 MG/2ML IJ SOLN: 4.0000 mg | Freq: Four times a day (QID) | INTRAMUSCULAR | Status: DC | PRN

## 2019-10-29 MED ORDER — OXYCODONE HCL 5 MG PO TABS: 5.0000 mg | ORAL_TABLET | ORAL | Status: DC | PRN

## 2019-10-29 MED ORDER — FENTANYL CITRATE (PF) 100 MCG/2ML IJ SOLN
INTRAMUSCULAR | Status: AC
  Filled 2019-10-29: qty 2

## 2019-10-29 SURGICAL SUPPLY — 13 items
CATH 5FR JL3.5 JR4 ANG PIG MP (CATHETERS) ×1 IMPLANT
CATH BALLN WEDGE 5F 110CM (CATHETERS) ×1 IMPLANT
DEVICE RAD COMP TR BAND LRG (VASCULAR PRODUCTS) ×1 IMPLANT
GLIDESHEATH SLEND A-KIT 6F 22G (SHEATH) ×1 IMPLANT
GUIDEWIRE INQWIRE 1.5J.035X260 (WIRE) IMPLANT
INQWIRE 1.5J .035X260CM (WIRE) ×4
KIT HEART LEFT (KITS) ×2 IMPLANT
PACK CARDIAC CATHETERIZATION (CUSTOM PROCEDURE TRAY) ×2 IMPLANT
SHEATH GLIDE SLENDER 4/5FR (SHEATH) ×1 IMPLANT
SHEATH PROBE COVER 6X72 (BAG) ×1 IMPLANT
TRANSDUCER W/STOPCOCK (MISCELLANEOUS) ×2 IMPLANT
TUBING CIL FLEX 10 FLL-RA (TUBING) ×2 IMPLANT
WIRE EMERALD ST .035X150CM (WIRE) ×1 IMPLANT

## 2019-10-29 NOTE — Progress Notes (Signed)
Ambulated in hallway and to the bathroom to void tol well No c/o dizziness

## 2019-10-29 NOTE — Progress Notes (Signed)
Ria Comment, NP called and informed of BP new orders noted

## 2019-10-29 NOTE — Discharge Instructions (Signed)
Radial Site Care  This sheet gives you information about how to care for yourself after your procedure. Your health care provider may also give you more specific instructions. If you have problems or questions, contact your health care provider. What can I expect after the procedure? After the procedure, it is common to have:  Bruising and tenderness at the catheter insertion area. Follow these instructions at home: Medicines  Take over-the-counter and prescription medicines only as told by your health care provider. Insertion site care  Follow instructions from your health care provider about how to take care of your insertion site. Make sure you: ? Wash your hands with soap and water before you change your bandage (dressing). If soap and water are not available, use hand sanitizer. ? Change your dressing as told by your health care provider. ? Leave stitches (sutures), skin glue, or adhesive strips in place. These skin closures may need to stay in place for 2 weeks or longer. If adhesive strip edges start to loosen and curl up, you may trim the loose edges. Do not remove adhesive strips completely unless your health care provider tells you to do that.  Check your insertion site every day for signs of infection. Check for: ? Redness, swelling, or pain. ? Fluid or blood. ? Pus or a bad smell. ? Warmth.  Do not take baths, swim, or use a hot tub until your health care provider approves.  You may shower 24-48 hours after the procedure, or as directed by your health care provider. ? Remove the dressing and gently wash the site with plain soap and water. ? Pat the area dry with a clean towel. ? Do not rub the site. That could cause bleeding.  Do not apply powder or lotion to the site. Activity   For 24 hours after the procedure, or as directed by your health care provider: ? Do not flex or bend the affected arm. ? Do not push or pull heavy objects with the affected arm. ? Do not  drive yourself home from the hospital or clinic. You may drive 24 hours after the procedure unless your health care provider tells you not to. ? Do not operate machinery or power tools.  Do not lift anything that is heavier than 10 lb (4.5 kg), or the limit that you are told, until your health care provider says that it is safe.  Ask your health care provider when it is okay to: ? Return to work or school. ? Resume usual physical activities or sports. ? Resume sexual activity. General instructions  If the catheter site starts to bleed, raise your arm and put firm pressure on the site. If the bleeding does not stop, get help right away. This is a medical emergency.  If you went home on the same day as your procedure, a responsible adult should be with you for the first 24 hours after you arrive home.  Keep all follow-up visits as told by your health care provider. This is important. Contact a health care provider if:  You have a fever.  You have redness, swelling, or yellow drainage around your insertion site. Get help right away if:  You have unusual pain at the radial site.  The catheter insertion area swells very fast.  The insertion area is bleeding, and the bleeding does not stop when you hold steady pressure on the area.  Your arm or hand becomes pale, cool, tingly, or numb. These symptoms may represent a serious problem   that is an emergency. Do not wait to see if the symptoms will go away. Get medical help right away. Call your local emergency services (911 in the [place] Do not drive yourself to the hospital. Summary  After the procedure, it is common to have bruising and tenderness at the site.  Follow instructions from your health care provider about how to take care of your radial site wound. Check the wound every day for signs of infection.  Do not lift anything that is heavier than 10 lb (4.5 kg), or the limit that you are told, until your health care provider says  that it is safe. This information is not intended to replace advice given to you by your health care provider. Make sure you discuss any questions you have with your health care provider. Document Revised: 09/20/2017 Document Reviewed: 09/20/2017 Elsevier Patient Education  2020 Elsevier Inc.  

## 2019-10-29 NOTE — Interval H&P Note (Signed)
Cath Lab Visit (complete for each Cath Lab visit)  Clinical Evaluation Leading to the Procedure:   ACS: No.  Non-ACS:    Anginal Classification: CCS II  Anti-ischemic medical therapy: Minimal Therapy (1 class of medications)  Non-Invasive Test Results: Intermediate-risk stress test findings: cardiac mortality 1-3%/year  Prior CABG: No previous CABG      History and Physical Interval Note:  10/29/2019 7:42 AM  Mackenzie Jensen  has presented today for surgery, with the diagnosis of Aortic stenosis.  The various methods of treatment have been discussed with the patient and family. After consideration of risks, benefits and other options for treatment, the patient has consented to  Procedure(s): RIGHT/LEFT HEART CATH AND CORONARY ANGIOGRAPHY (N/A) as a surgical intervention.  The patient's history has been reviewed, patient examined, no change in status, stable for surgery.  I have reviewed the patient's chart and labs.  Questions were answered to the patient's satisfaction.     Belva Crome III

## 2019-10-29 NOTE — Progress Notes (Signed)
Ria Comment, NP called and informed of last Bp states ok to discharge after she walks around and still feels good.

## 2019-10-31 ENCOUNTER — Telehealth: Payer: Self-pay | Admitting: Nurse Practitioner

## 2019-10-31 NOTE — Telephone Encounter (Signed)
Patient had a heart cath done on Tuesday and has some questions she would like to ask.

## 2019-10-31 NOTE — Telephone Encounter (Signed)
With recent cath, unlikely pain is cardiac in nature, especially since pain lasts only a few seconds. If pain worsens would call clinic back.

## 2019-10-31 NOTE — Telephone Encounter (Addendum)
Pt had R/LHC 10/29/19.

## 2019-10-31 NOTE — Telephone Encounter (Signed)
Pt states she did fine after R/LHC done 10/29/19. Pt states last night she began to feel a twinge in her chest when she takes a deep breath, lasts a few seconds, then goes away. Pt states  it is usual for her to be short of breath, denies any worsening shortness of breath or any other symptoms, including cough, dizziness, other pain. Pt states nothing makes it better or worse, including turning or changing position.   Pt is going to beach tomorrow and called to discuss symptoms with Cecille Rubin  before she went out of town, Cecille Rubin is off today.  Pt advised I will forward to Dr Curt Bears (DOD) for review and any recommendations.

## 2019-10-31 NOTE — Telephone Encounter (Signed)
Pt aware of  review and recommendations by Dr Curt Bears, thanked me for call.

## 2019-11-07 ENCOUNTER — Other Ambulatory Visit: Payer: Self-pay

## 2019-11-07 DIAGNOSIS — I350 Nonrheumatic aortic (valve) stenosis: Secondary | ICD-10-CM

## 2019-11-11 ENCOUNTER — Other Ambulatory Visit: Payer: Self-pay

## 2019-11-11 ENCOUNTER — Ambulatory Visit: Payer: Medicare PPO | Admitting: Cardiovascular Disease

## 2019-11-11 ENCOUNTER — Encounter: Payer: Self-pay | Admitting: Cardiovascular Disease

## 2019-11-11 VITALS — BP 132/74 | HR 64 | Ht 66.0 in | Wt 209.0 lb

## 2019-11-11 DIAGNOSIS — I350 Nonrheumatic aortic (valve) stenosis: Secondary | ICD-10-CM | POA: Diagnosis not present

## 2019-11-11 NOTE — Patient Instructions (Signed)
Please see separate instruction letter for upcoming appointment information.

## 2019-11-11 NOTE — Progress Notes (Signed)
[facility]   MULTIDISCIPLINARY HEART VALVE TEAM  Date:  11/13/2019   ID:  [name], DOB June 26, 1934, MRN [id number]  PCP:  Moshe Cipro, MD   Chief Complaint  Patient presents with  . Shortness of Breath     HISTORY OF PRESENT ILLNESS: Mackenzie Jensen is a 86 y.o. female who presents for evaluation of severe aortic stenosis, referred by Truitt Merle.   The patient is here with her daughter today.  She has been noted to have progressive aortic stenosis on serial echo studies.  The patient has a long history of shortness of breath, but this is progressed significantly over the last 6 months.  She reports dyspnea with low-level exertion such as walking short distances on level ground or climbing 1 flight of stairs.  She denies orthopnea, PND, or chest pain.  She has had no lightheadedness or syncope.  She has some problems with her right knee, but otherwise has no significant functional limitations other than as described above.  She had pulmonary function test performed in 2018 that were suggestive of mild COPD.  The first echo on file in our system is from 2015 when she was noted to have normal function of her aortic valve.  In 2018 she had developed mild aortic stenosis with peak and mean transvalvular gradients of 23 and 13 mmHg, respectively.  Her to 2020 echocardiogram demonstrated significant progression of aortic stenosis with peak systolic velocity of 3.9 m/s, peak and mean gradients of 61 and 37 mmHg, and dimensionless index of 0.17.  She had a recent echocardiogram with similar findings demonstrating peak and mean gradients of 62 and 38 mmHg and a dimensionless index of 0.2 consistent with severe aortic stenosis.  Past Medical History:  Diagnosis Date  . Anxiety   . GERD (gastroesophageal reflux disease)   . Hiatal hernia   . Insomnia   . Osteopenia   . Right knee DJD   . Vulvar cancer [facility])     Current Outpatient Medications  Medication Sig Dispense  Refill  . celecoxib (CELEBREX) 200 MG capsule Take 200 mg by mouth daily.     . Cholecalciferol (VITAMIN D-3) 125 MCG (5000 UT) TABS Take 5,000 Units by mouth daily.    Marland Kitchen escitalopram (LEXAPRO) 10 MG tablet Take 10 mg by mouth daily.     Marland Kitchen esomeprazole (NEXIUM) 40 MG capsule TAKE  (1)  CAPSULE  TWICE DAILY (TAKE ON AN EMPTY STOMACH AT LEAST 30MIN- UTES BEFORE MEALS). 60 capsule 0  . ferrous sulfate 325 (65 FE) MG tablet Take 325 mg by mouth daily with breakfast.    . metoprolol succinate (TOPROL-XL) 25 MG 24 hr tablet Take 25 mg by mouth daily.    . Misc Natural Products (OSTEO BI-FLEX JOINT SHIELD PO) Take 1 tablet by mouth daily.    . Multiple Vitamin (MULTIVITAMIN WITH MINERALS) TABS tablet Take 1 tablet by mouth daily. VitaFusion Women's Multivitamin    . Travoprost, BAK Free, (TRAVATAN) 0.004 % SOLN ophthalmic solution Place 1 drop into both eyes at bedtime.     . tretinoin (RETIN-A) 0.025 % cream Apply 1 application topically at bedtime as needed (wrinkles).      No current facility-administered medications for this visit.    ALLERGIES:   Dorzolamide hcl-timolol mal and Novocain [procaine]   SOCIAL HISTORY:  The patient  reports that she has quit smoking. Her smoking use included cigarettes. She has a 30.00 pack-year smoking history. She has never used smokeless tobacco.  She reports that she does not drink alcohol or use drugs.   FAMILY HISTORY:  The patient's family history includes CAD (age of onset: 83) in her brother; CAD (age of onset: 15) in her father.   REVIEW OF SYSTEMS:  Positive for right knee pain.   All other systems are reviewed and negative.   PHYSICAL EXAM: VS:  BP 132/74   Pulse 64   Ht 5\' 6"  (1.676 m)   Wt 209 lb (94.8 kg)   SpO2 95%   BMI 33.73 kg/m  , BMI Body mass index is 33.73 kg/m. GEN: Well nourished, well developed, overweight elderly woman in no acute distress HEENT: normal Neck: No JVD. carotids 2+ with bilateral bruits Cardiac: The heart is RRR  with a 3/6 harsh systolic murmur at the RUSB.  1+ bilateral ankle edema. Pedal pulses 2+ = bilaterally  Respiratory:  clear to auscultation bilaterally GI: soft, nontender, nondistended, + BS MS: no deformity or atrophy Skin: warm and dry, no rash Neuro:  Strength and sensation are intact Psych: euthymic mood, full affect  EKG:  EKG from 10-21-2019 reviewed and demonstrates NSR 70 bpm, within normal limits  RECENT LABS: 10/21/2019: ALT 7; BUN 14; Creatinine, Ser 0.74; Platelets 203 10/29/2019: Hemoglobin 12.2; Potassium 3.5; Sodium 134  No results found for requested labs within last 8760 hours.   CrCl cannot be calculated (Patient's most recent lab result is older than the maximum 21 days allowed.).   Wt Readings from Last 3 Encounters:  11/11/19 209 lb (94.8 kg)  10/29/19 205 lb (93 kg)  10/21/19 209 lb 12.8 oz (95.2 kg)     CARDIAC STUDIES: Cardiac Cath: Conclusion   Calcific aortic stenosis, moderately severe with peak to peak gradient 36 mmHg and mean gradient 28 mmHg.  Calculated valve area 1.09 cm  20% distal left main  Irregularities up to 30% in the proximal to mid LAD.  First diagonal contains proximal 50% narrowing.  The circumflex is dominant.  Luminal irregularities are noted.  Nondominant right coronary.  Widely patent vessel.  LVEDP 16 mmHg  RECOMMENDATIONS:   Refer to structural heart clinic.    Dyspnea is out of proportion to hemodynamics.  Other considerations include diastolic heart failure in addition to aortic stenosis.   Coronary Findings  Diagnostic Dominance: Left Left Anterior Descending  Prox LAD to Mid LAD lesion 30% stenosed  Prox LAD to Mid LAD lesion is 30% stenosed.  First Diagonal Branch  1st Diag lesion 30% stenosed  1st Diag lesion is 30% stenosed.  Left Circumflex  Second Obtuse Marginal Branch  Vessel is large in size.    STS RISK CALCULATOR: Isolated Aortic Valve REplacement: Risk of Mortality: 2.116% Renal  Failure: 1.485% Permanent Stroke: 1.642% Prolonged Ventilation: 6.269% DSW Infection: 0.122% Reoperation: 2.921% Morbidity or Mortality: 10.395% Short Length of Stay: 25.959% Long Length of Stay: 5.945%  ASSESSMENT AND PLAN: 1.  Severe, stage D1 aortic stenosis: This is associated with New York Heart Association functional class III symptoms of exertional dyspnea.  I have reviewed the natural history of aortic stenosis with the patient and their family members who are present today. We have discussed the limitations of medical therapy and the poor prognosis associated with symptomatic aortic stenosis. We have reviewed potential treatment options, including palliative medical therapy, conventional surgical aortic valve replacement, and transcatheter aortic valve replacement. We discussed treatment options in the context of the patient's specific comorbid medical conditions.    The patient's cardiac catheterization images and data are  personally reviewed.  She has mild nonobstructive coronary artery disease without any significant high-grade lesions present.  Her echocardiogram shows vigorous LV systolic function and diffuse calcification and restriction of a tricuspid degenerative aortic valve.  Doppler data are outlined in the HPI above and are consistent with severe aortic stenosis.  TAVR would likely be an ideal treatment option in this 86 year old patient.  I demonstrated the TAVR procedural animation with the patient and her daughter today.  I explained that procedural aspects, potential risks, expected benefits, and alternatives to treatment.  We specifically discussed the fact that she has some degree of longstanding dyspnea and that even a successful TAVR might not completely resolve her dyspnea.  I would expect some benefit and certainly it should prevent progressive symptoms of heart failure.  I suspect that COPD and deconditioning also contribute to her symptoms.  She will undergo a  gated cardiac CTA study and a CTA of the chest abdomen and pelvis to evaluate anatomic suitability for TAVR.  After her CT studies are completed, she will undergo formal surgical consultation as part of a multidisciplinary heart team approach to her care.   Deatra James 11/13/2019 3:11 PM     [place] [address] [place]  [address]  [phone] (office) [phone] (fax)

## 2019-11-13 ENCOUNTER — Encounter: Payer: Self-pay | Admitting: Physical Therapy

## 2019-11-13 ENCOUNTER — Ambulatory Visit (HOSPITAL_COMMUNITY)
Admission: RE | Admit: 2019-11-13 | Discharge: 2019-11-13 | Disposition: A | Discharge status: Home / Self Care | Payer: Medicare PPO | Source: Ambulatory Visit | Attending: Cardiovascular Disease | Admitting: Cardiovascular Disease

## 2019-11-13 ENCOUNTER — Ambulatory Visit: Payer: Medicare PPO | Attending: Cardiovascular Disease | Admitting: Physical Therapy

## 2019-11-13 ENCOUNTER — Encounter: Payer: Self-pay | Admitting: Cardiovascular Disease

## 2019-11-13 ENCOUNTER — Other Ambulatory Visit: Payer: Self-pay

## 2019-11-13 DIAGNOSIS — I350 Nonrheumatic aortic (valve) stenosis: Secondary | ICD-10-CM | POA: Insufficient documentation

## 2019-11-13 DIAGNOSIS — R2689 Other abnormalities of gait and mobility: Secondary | ICD-10-CM | POA: Insufficient documentation

## 2019-11-13 MED ORDER — IOHEXOL 350 MG/ML SOLN
100.0000 mL | Freq: Once | INTRAVENOUS | Status: AC | PRN
  Administered 2019-11-13: 100 mL via INTRAVENOUS

## 2019-11-13 NOTE — Progress Notes (Signed)
Carotid artery duplex completed.  Refer to "CV Proc" under chart review to view preliminary results.  11/13/2019 9:15 AM Kelby Aline., MHA, RVT, RDCS, RDMS

## 2019-11-13 NOTE — Therapy (Signed)
[place], [place], [address] Phone: [phone]   Fax:  [phone]  Physical Therapy Evaluation  Patient Details  Name: Mackenzie Jensen MRN: [id number] Date of Birth: 1934-04-08 Referring Provider (PT): Isaiah Blakes    Encounter Date: 11/13/2019  PT End of Session - 11/13/19 1632    Visit Number  1    Number of Visits  1    Date for PT Re-Evaluation  11/13/19    PT Start Time  1230    PT Stop Time  1255    PT Time Calculation (min)  25 min    Activity Tolerance  Patient tolerated treatment well    Behavior During Therapy  [facility] for tasks assessed/performed       Past Medical History:  Diagnosis Date  . Anxiety   . GERD (gastroesophageal reflux disease)   . Hiatal hernia   . Insomnia   . Osteopenia   . Right knee DJD   . Vulvar cancer [facility])     Past Surgical History:  Procedure Laterality Date  . COSMETIC SURGERY    . RIGHT/LEFT HEART CATH AND CORONARY ANGIOGRAPHY N/A 10/29/2019   Procedure: RIGHT/LEFT HEART CATH AND CORONARY ANGIOGRAPHY;  Surgeon: Belva Crome, MD;  Location: [facility];  Service: Cardiovascular;  Laterality: N/A;  . TONSILLECTOMY AND ADENOIDECTOMY    . TUBAL LIGATION    . VULVA SURGERY      There were no vitals filed for this visit.   Subjective Assessment - 11/13/19 1234    Subjective  Patient noticed onset of dyspena with walking about a year ago. She also has chest pain at times which she belives is releated to acid reflux but therapy will monior. She has a history of right knee pain but it is not hurting today.    Currently in Pain?  No/denies         [facility] PT Assessment - 11/13/19 0001      Assessment   Medical Diagnosis  Severe Aortic Stenosis     Referring Provider (PT)  DrMichael Cooper     Onset Date/Surgical Date  --   12 months prior    Next MD Visit  Wednesday       Precautions   Precautions  None      Restrictions   Weight Bearing  Restrictions  No      Balance Screen   Has the patient fallen in the past 6 months  No    Has the patient had a decrease in activity level because of a fear of falling?   No    Is the patient reluctant to leave their home because of a fear of falling?   No      Prior Function   Level of Independence  Independent      Cognition   Overall Cognitive Status  Within Functional Limits for tasks assessed    Attention  Focused    Focused Attention  Appears intact    Memory  Appears intact    Awareness  Appears intact    Problem Solving  Appears intact      Sensation   Light Touch  Appears Intact    Additional Comments  denies paratheisas      Coordination   Gross Motor Movements are Fluid and Coordinated  Yes    Fine Motor Movements are Fluid and Coordinated  Yes      ROM / Strength   AROM /  PROM / Strength  AROM;PROM;Strength      Strength   Overall Strength Comments  4/5 left hip strength all other strength 5/5     Strength Assessment Site  Hand    Right/Left hand  Right;Left    Right Hand Grip (lbs)  25    Left Hand Grip (lbs)  30      6 minute walk test results    Endurance additional comments  100 feet with a rest break. Ambualted another 100' sand declined to go further 2nd to fatigue. 87% disability        [facility] Pre-Surgical Assessment - 11/13/19 0001    5 Meter Walk Test- trial 1  5 sec    5 Meter Walk Test- trial 2  6 sec.     5 Meter Walk Test- trial 3  6 sec.    5 meter walk test average  5.67 sec    4 Stage Balance Test tolerated for:   10 sec.    4 Stage Balance Test Position  2    comment  unable to come to full tandem without assist     Sit To Stand Test- trial 1  5 sec.    Comment  16     ADL/IADL Independent with:  Bathing;Dressing;Meal prep;Finances;Yard work    6 Minute Walk- Baseline  yes    BP (mmHg)  97/62    HR (bpm)  75    02 Sat (%RA)  95 %    Modified Borg Scale for Dyspnea  0- Nothing at all    Perceived Rate of Exertion (Borg)  6-    6  Minute Walk Post Test  yes    BP (mmHg)  118/71    HR (bpm)  81    02 Sat (%RA)  94 %    Modified Borg Scale for Dyspnea  4- somewhat severe    Perceived Rate of Exertion (Borg)  13- Somewhat hard    Aerobic Endurance Distance Walked  200              Objective measurements completed on examination: See above findings.          Clinical Impression Statement reports onset of 51 approximant: Pt is a female yo dyspnea with exertion presenting to OP PT for evaluation prior to possible TAVR surgery due to severe aortic stenosis. Symptoms began 12 months ago. Symptoms are limiting ability to ambulate and shop. Pt presents with normal ROM and strength, decreases balance and is at high fall risk 4 stage balance test, normal walking speed and decreased aerobic endurance per 6 minute walk test. Pt ambulated 100 feet in 45 before requesting a seated rest beak lasting 30. At time of rest, patient's HR was 81 bpm and O2 was 95 on room air. Pt reported 4/10 shortness of breath on modified scale for dyspnea. Pt able to resume after rest and ambulate an additional 100 feet. Pt ambulated a total of 200 feet in 6 minute walk. B/P  increased significantly with 6 minute walk test. Based on the Short Physical Performance Battery, patient has a frailty rating of 6/12 with </= 5/12 considered frail.                   Plan - 11/13/19 1614    Clinical Impression Statement  See below    PT Frequency  One time visit    Consulted and Agree with Plan of Care  Patient  Patient will benefit from skilled therapeutic intervention in order to improve the following deficits and impairments:     Visit Diagnosis: Other abnormalities of gait and mobility - Plan: PT plan of care cert/re-cert     Problem List Patient Active Problem List   Diagnosis Date Noted  . Chest pain of uncertain etiology   . Severe aortic stenosis 10/28/2019  . Reactive airway disease with wheezing 07/13/2016  .  Generalized anxiety disorder 06/16/2014  . Overweight   . Cataract 01/06/2014  . Recurrent herpes labialis 01/06/2014  . 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 ANEMIA 02/23/2010  . Mixed hyperlipidemia 12/29/2009  . GERD 11/19/2009  . MURMUR 11/19/2009    Carney Living 11/13/2019, 4:32 PM  [facility] [address] [place], [place], [address] Phone: [phone]   Fax:  [phone]  Name: Mackenzie Jensen MRN: [id number] Date of Birth: Jan 16, 1934

## 2019-11-20 ENCOUNTER — Other Ambulatory Visit: Payer: Self-pay

## 2019-11-20 ENCOUNTER — Encounter: Payer: Self-pay | Admitting: Surgery

## 2019-11-20 ENCOUNTER — Institutional Professional Consult (permissible substitution): Payer: Medicare PPO | Admitting: Surgery

## 2019-11-20 VITALS — BP 109/64 | HR 68 | Temp 97.3°F | Resp 16 | Ht 66.0 in | Wt 203.0 lb

## 2019-11-20 DIAGNOSIS — I350 Nonrheumatic aortic (valve) stenosis: Secondary | ICD-10-CM | POA: Diagnosis not present

## 2019-11-20 NOTE — Progress Notes (Signed)
Patient ID: Mackenzie Jensen, female   DOB: 28-Jun-1934, 86 y.o.   MRN: [id number]  [facility] CONSULTATION REPORT  Referring Provider is Belva Crome, MD Primary Cardiologist is No primary care provider on file. PCP is Moshe Cipro, MD  Chief Complaint  Patient presents with  . Aortic Stenosis    TAVR EVAL .Marland Kitchen..review all studies and procedures    HPI:  The patient is an 86 year old woman with a history of hiatal hernia and GERD, anxiety, insomnia, and aortic stenosis who reports at least a 5-year history of some exertional fatigue and shortness of breath.  She said this is progressed significantly over the past 6 months with shortness of breath on exertion occurring on level ground or climbing 1 flight of stairs as well as doing normal daily activities in her house.  Her daughter is with her today and said that her main activity for the week is going to the beauty parlor to have her hair done and that wears her out.  She has had no chest pain or pressure.  She has had some dizziness with bending over but no syncope.  She denies orthopnea.  She does report some postprandial tightness in her chest and shortness of breath.  She had an echocardiogram in 2015 which showed a trileaflet aortic valve with trivial regurgitation and no stenosis.  A repeat echocardiogram in March 2018 showed mildly calcified aortic valve leaflets with an increase in the mean gradient to 13 mmHg consistent with mild aortic stenosis.  Subsequent echoes have shown a progressive increase in the mean gradient across her aortic valve.  It was noted to be 37 mmHg in June 2020 and was 32mmHg on her most recent echo dated 10/14/2019.  Aortic valve area by VTI is measured at 0.62 cm.  Left ventricular ejection fraction is 60 to 65% with moderate LVH and grade 1 diastolic dysfunction.  She is here today with her daughter.  She confirms her progression of  symptoms over the past 6 months to the point where she can barely do any activities.  Patient did have a pulmonary work-up in 2018 including pulmonary function test which showed mild COPD.  She quit smoking about 40 years ago.  Past Medical History:  Diagnosis Date  . Anxiety   . GERD (gastroesophageal reflux disease)   . Hiatal hernia   . Insomnia   . Osteopenia   . Right knee DJD   . Vulvar cancer [facility])     Past Surgical History:  Procedure Laterality Date  . COSMETIC SURGERY    . RIGHT/LEFT HEART CATH AND CORONARY ANGIOGRAPHY N/A 10/29/2019   Procedure: RIGHT/LEFT HEART CATH AND CORONARY ANGIOGRAPHY;  Surgeon: Belva Crome, MD;  Location: [facility];  Service: Cardiovascular;  Laterality: N/A;  . TONSILLECTOMY AND ADENOIDECTOMY    . TUBAL LIGATION    . VULVA SURGERY      Family History  Problem Relation Age of Onset  . CAD Father 21  . CAD Brother 3    Social History   Socioeconomic History  . Marital status: Married    Spouse name: Not on file  . Number of children: 2  . Years of education: Not on file  . Highest education level: Not on file  Occupational History  . Not on file  Tobacco Use  . Smoking status: Former Smoker    Packs/day: 1.00    Years: 30.00  Pack years: 30.00    Types: Cigarettes  . Smokeless tobacco: Never Used  . Tobacco comment: Quit 30 years ago  Substance and Sexual Activity  . Alcohol use: No  . Drug use: No  . Sexual activity: Not on file  Other Topics Concern  . Not on file  Social History Narrative   Lives with husband.    Social Determinants of Health   Financial Resource Strain:   . Difficulty of Paying Living Expenses:   Food Insecurity:   . Worried About Charity fundraiser in the Last Year:   . Arboriculturist in the Last Year:   Transportation Needs:   . Film/video editor (Medical):   Marland Kitchen Lack of Transportation (Non-Medical):   Physical Activity:   . Days of Exercise per Week:   . Minutes of Exercise  per Session:   Stress:   . Feeling of Stress :   Social Connections:   . Frequency of Communication with Friends and Family:   . Frequency of Social Gatherings with Friends and Family:   . Attends Religious Services:   . Active Member of Clubs or Organizations:   . Attends Archivist Meetings:   Marland Kitchen Marital Status:   Intimate Partner Violence:   . Fear of Current or Ex-Partner:   . Emotionally Abused:   Marland Kitchen Physically Abused:   . Sexually Abused:     Current Outpatient Medications  Medication Sig Dispense Refill  . celecoxib (CELEBREX) 200 MG capsule Take 200 mg by mouth daily.     . Cholecalciferol (VITAMIN D-3) 125 MCG (5000 UT) TABS Take 5,000 Units by mouth daily.    Marland Kitchen escitalopram (LEXAPRO) 10 MG tablet Take 10 mg by mouth daily.     Marland Kitchen esomeprazole (NEXIUM) 40 MG capsule TAKE  (1)  CAPSULE  TWICE DAILY (TAKE ON AN EMPTY STOMACH AT LEAST 30MIN- UTES BEFORE MEALS). 60 capsule 0  . ferrous sulfate 325 (65 FE) MG tablet Take 325 mg by mouth daily with breakfast.    . metoprolol succinate (TOPROL-XL) 25 MG 24 hr tablet Take 25 mg by mouth daily.    . Misc Natural Products (OSTEO BI-FLEX JOINT SHIELD PO) Take 1 tablet by mouth daily.    . Multiple Vitamin (MULTIVITAMIN WITH MINERALS) TABS tablet Take 1 tablet by mouth daily. VitaFusion Women's Multivitamin    . Travoprost, BAK Free, (TRAVATAN) 0.004 % SOLN ophthalmic solution Place 1 drop into both eyes at bedtime.     . tretinoin (RETIN-A) 0.025 % cream Apply 1 application topically at bedtime as needed (wrinkles).      No current facility-administered medications for this visit.    Allergies  Allergen Reactions  . Dorzolamide Hcl-Timolol Mal Shortness Of Breath  . Novocain [Procaine] Shortness Of Breath and Other (See Comments)      Review of Systems:   General:  normal appetite, + decreased energy, no weight gain, no weight loss, no fever  Cardiac:  no chest pain with exertion, no chest pain at rest, +SOB with mild  exertion, no resting SOB, no PND, no orthopnea, no palpitations, no arrhythmia, no atrial fibrillation, + LE edema, + dizzy spells, no syncope  Respiratory:  + exertional and postprandial shortness of breath, no home oxygen, no productive cough, no dry cough, no bronchitis, + wheezing, no hemoptysis, no asthma, no pain with inspiration or cough, no sleep apnea, no CPAP at night  GI:   no difficulty swallowing, + reflux, + frequent heartburn, +  hiatal hernia, no abdominal pain, no constipation, no diarrhea, no hematochezia, no hematemesis, no melena  GU:   no dysuria,  + frequency, no urinary tract infection, no hematuria,  no kidney stones, no kidney disease  Vascular:  no pain suggestive of claudication, no pain in feet, no leg cramps, no varicose veins, no DVT, no non-healing foot ulcer  Neuro:   no stroke, no TIA's, no seizures, no headaches, no temporary blindness one eye,  no slurred speech, no peripheral neuropathy, no chronic pain, no instability of gait, no memory/cognitive dysfunction  Musculoskeletal: + arthritis, + joint swelling, no myalgias, + difficulty walking, + reduced mobility   Skin:   no rash, no itching, no skin infections, no pressure sores or ulcerations  Psych:   + anxiety, no depression, no nervousness, no unusual recent stress  Eyes:   no blurry vision, + floaters, no recent vision changes, + wears glasses or contacts  ENT:   + hearing loss, no loose or painful teeth, implants, last saw dentist 6 months ago  Hematologic:  + easy bruising, no abnormal bleeding, no clotting disorder, no frequent epistaxis  Endocrine:  no diabetes, does not check CBG's at hom    Physical Exam:   BP 109/64 (BP Location: Left Arm, Patient Position: Sitting, Cuff Size: Large)   Pulse 68   Temp (!) 97.3 F (36.3 C)   Resp 16   Ht 5\' 6"  (1.676 m)   Wt 203 lb (92.1 kg)   SpO2 95%   BMI 32.77 kg/m   General:  Obese,well-appearing  HEENT:  Unremarkable, NCAT, PERLA, EOMI  Neck:   no JVD,  no bruits, no adenopathy   Chest:   clear to auscultation, symmetrical breath sounds, no wheezes, no rhonchi   CV:   RRR, grade lll/VI crescendo/decrescendo murmur heard best at RSB,  no diastolic murmur  Abdomen:  soft, non-tender, no masses   Extremities:  warm, well-perfused, pulses palpable at ankle, no LE edema  Rectal/GU  Deferred  Neuro:   Grossly non-focal and symmetrical throughout  Skin:   Clean and dry, no rashes, no breakdown   Diagnostic Tests:   Patient Name:  OLETHIA FEHRINGER Date of Exam: 10/14/2019  Medical Rec #: [id number]   Height:    66.0 in  Accession #:  [id number]  Weight:    209.8 lb  Date of Birth: December 13, 1933   BSA:     2.04 m  Patient Age:  36 years   BP:      121/83 mmHg  Patient Gender: F       HR:      86 bpm.  Exam Location: [address]   Procedure: 2D Echo, Cardiac Doppler and Color Doppler   Indications:  I35.0 Aortic stenosis    History:    Patient has prior history of Echocardiogram examinations,  most         recent 02/25/2019. Signs/Symptoms:Chest Pain and Murmur;  Risk         Factors:Dyslipidemia.    Sonographer:  Diamond Nickel RCS  Referring Phys: [place]    1. Left ventricular ejection fraction, by estimation, is 60 to 65%. The  left ventricle has normal function. The left ventricle has no regional  wall motion abnormalities. There is moderate left ventricular hypertrophy.  Left ventricular diastolic  parameters are consistent with Grade I diastolic dysfunction (impaired  relaxation).  2. Right ventricular systolic function is normal. The right ventricular  size is normal.  There is normal pulmonary artery systolic pressure. The  estimated right ventricular systolic pressure is Q000111Q mmHg.  3. Left atrial size was mildly dilated.  4. The mitral valve is normal in structure and function. No evidence of  mitral valve regurgitation. No  evidence of mitral stenosis.  5. The aortic valve is tricuspid. Aortic valve regurgitation is trivial.  Severe aortic valve stenosis. Aortic valve area, by VTI measures 0.62 cm.  Aortic valve mean gradient measures 38.0 mmHg.  6. Aortic dilatation noted. There is mild dilatation of the ascending  aorta.  7. The inferior vena cava is normal in size with greater than 50%  respiratory variability, suggesting right atrial pressure of 3 mmHg.   FINDINGS  Left Ventricle: Left ventricular ejection fraction, by estimation, is 60  to 65%. The left ventricle has normal function. The left ventricle has no  regional wall motion abnormalities. The left ventricular internal cavity  size was normal in size. There is  moderate left ventricular hypertrophy. Left ventricular diastolic  parameters are consistent with Grade I diastolic dysfunction (impaired  relaxation).   Right Ventricle: The right ventricular size is normal. No increase in  right ventricular wall thickness. Right ventricular systolic function is  normal. There is normal pulmonary artery systolic pressure. The tricuspid  regurgitant velocity is 2.31 m/s, and  with an assumed right atrial pressure of 3 mmHg, the estimated right  ventricular systolic pressure is Q000111Q mmHg.   Left Atrium: Left atrial size was mildly dilated.   Right Atrium: Right atrial size was normal in size.   Pericardium: There is no evidence of pericardial effusion.   Mitral Valve: The mitral valve is normal in structure and function. Mild  mitral annular calcification. No evidence of mitral valve regurgitation.  No evidence of mitral valve stenosis.   Tricuspid Valve: The tricuspid valve is normal in structure. Tricuspid  valve regurgitation is trivial.   Aortic Valve: The aortic valve is tricuspid. Aortic valve regurgitation is  trivial. Severe aortic stenosis is present. Aortic valve mean gradient  measures 38.0 mmHg. Aortic valve peak gradient  measures 61.8 mmHg. Aortic  valve area, by VTI measures 0.62  cm.   Pulmonic Valve: The pulmonic valve was normal in structure. Pulmonic valve  regurgitation is not visualized.   Aorta: Aortic dilatation noted. There is mild dilatation of the ascending  aorta.   Venous: The inferior vena cava is normal in size with greater than 50%  respiratory variability, suggesting right atrial pressure of 3 mmHg.   IAS/Shunts: No atrial level shunt detected by color flow Doppler.     LEFT VENTRICLE  PLAX 2D  LVIDd:     3.40 cm Diastology  LVIDs:     2.20 cm LV e' lateral:  6.32 cm/s  LV PW:     1.40 cm LV E/e' lateral: 8.9  LV IVS:    1.60 cm LV e' medial:  5.35 cm/s  LVOT diam:   2.00 cm LV E/e' medial: 10.5  LV SV:     63.46 ml  LV SV Index:  14.58  LVOT Area:   3.14 cm     RIGHT VENTRICLE  RV Basal diam: 2.30 cm  RV S prime:   9.08 cm/s  TAPSE (M-mode): 1.9 cm  RVSP:      24.3 mmHg   LEFT ATRIUM       Index    RIGHT ATRIUM      Index  LA diam:    3.00 cm 1.47 cm/m  RA Pressure: 3.00 mmHg  LA Vol (A2C):  62.4 ml 30.58 ml/m RA Area:   16.20 cm  LA Vol (A4C):  46.8 ml 22.93 ml/m RA Volume:  38.40 ml 18.82 ml/m  LA Biplane Vol: 55.5 ml 27.19 ml/m  AORTIC VALVE  AV Area (Vmax):  0.70 cm  AV Area (Vmean):  0.69 cm  AV Area (VTI):   0.62 cm  AV Vmax:      393.00 cm/s  AV Vmean:     288.000 cm/s  AV VTI:      1.030 m  AV Peak Grad:   61.8 mmHg  AV Mean Grad:   38.0 mmHg  LVOT Vmax:     87.50 cm/s  LVOT Vmean:    63.300 cm/s  LVOT VTI:     0.202 m  LVOT/AV VTI ratio: 0.20    AORTA  Ao Root diam: 2.80 cm   MITRAL VALVE        TRICUSPID VALVE  MV Area (PHT): 3.74 cm  TR Peak grad:  21.3 mmHg  MV Decel Time: 203 msec  TR Vmax:    231.00 cm/s  MV E velocity: 56.10 cm/s Estimated RAP: 3.00 mmHg  MV A velocity: 83.60 cm/s RVSP:      24.3  mmHg  MV E/A ratio: 0.67               SHUNTS               Systemic VTI: 0.20 m               Systemic Diam: 2.00 cm   Mackenzie Champagne MD  Electronically signed by Mackenzie Champagne MD  Signature Date/Time: 10/14/2019/9:33:03 PM    Physicians Panel Physicians Referring Physician Case Authorizing Physician  Belva Crome, MD (Primary)       Procedures RIGHT/LEFT HEART CATH AND CORONARY ANGIOGRAPHY     Conclusion Calcific aortic stenosis, moderately severe with peak to peak gradient 36 mmHg and mean gradient 28 mmHg. Calculated valve area 1.09 cm  20% distal left main  Irregularities up to 30% in the proximal to mid LAD. First diagonal contains proximal 50% narrowing.  The circumflex is dominant. Luminal irregularities are noted.  Nondominant right coronary. Widely patent vessel.  LVEDP 16 mmHg RECOMMENDATIONS:  Refer to structural heart clinic.  Dyspnea is out of proportion to hemodynamics. Other considerations include diastolic heart failure in addition to aortic stenosis.     Recommendations Antiplatelet/Anticoag No indication for antiplatelet therapy at this time .        Indications Aortic stenosis, severe [I35.0 (ICD-10-CM)]     Procedural Details Technical Details The right radial area was sterilely prepped and draped. Intravenous sedation with Versed and fentanyl was administered. 1% Xylocaine was infiltrated to achieve local analgesia. Using real-time vascular ultrasound, a double wall stick with an angiocath was utilized to obtain intra-arterial access. A VUS image was saved for the permanent record.The modified Seldinger technique was used to place a 19F " Slender" sheath in the right radial artery. Weight based heparin was administered. Coronary angiography was done using 5 F catheters. Right coronary angiography was performed with a JR4. Left ventricular hemodymic recordings and angiography was done using the JR 4 catheter  and hand injection. Left coronary angiography was performed with a JL 3.5 cm.  Right heart catheterization was performed by exchanging a previously placed antecubital IV angio-cath for a 5 French Slender sheath. 1% Xylocaine was used to locally nesthetize the area around the  IV site. The IV catheter was wired using an .018 guidewire. The modified Seldinger technique was used to place the 5 [place] sheath. Double glove technique was used to enhance sterility. After sheath insertion, right heart cath was performed using a 5 French balloon tipped catheter and fluoroscopic guidance. Pressures were recorded in each chamber and in the pulmonary capillary wedge position.. The main pulmonary artery O2 saturation was sampled.   Hemostasis was achieved using a pneumatic band.  During this procedure the patient is administered a total of Versed 2 mg and Fentanyl 50 mg to achieve and maintain moderate conscious sedation. The patient's heart rate, blood pressure, and oxygen saturation are monitored continuously during the procedure. The period of conscious sedation is 35 minutes, of which I was present face-to-face 100% of this time. Estimated blood loss <50 mL.   During this procedure medications were administered to achieve and maintain moderate conscious sedation while the patient's heart rate, blood pressure, and oxygen saturation were continuously monitored and I was present face-to-face 100% of this time.     Medications (Filter: Administrations occurring from 10/29/19 0714 to 10/29/19 0827)  Continuous medications are totaled by the amount administered until 10/29/19 0827.  midazolam (VERSED) injection (mg) Total dose: 2 mg  Date/Time   Rate/Dose/Volume Action  10/29/19 0747  1 mg Given  0753  1 mg Given  fentaNYL (SUBLIMAZE) injection (mcg) Total dose: 50 mcg  Date/Time   Rate/Dose/Volume Action  10/29/19 0747  25 mcg Given  0753  25 mcg Given  0.9 % sodium chloride infusion (mL/hr) Total dose:  Cannot be calculated* Dosing weight: 93  *Continuous medication not stopped within the calculation time range.  Date/Time   Rate/Dose/Volume Action  10/29/19 0748  50 mL/hr New Bag/Given  lidocaine (PF) (XYLOCAINE) 1 % injection (mL) Total volume: 4 mL  Date/Time   Rate/Dose/Volume Action  10/29/19 0755  2 mL Given  0756  2 mL Given  Heparin (Porcine) in NaCl 1000-0.9 UT/500ML-% SOLN (mL) Total volume: 1,000 mL  Date/Time   Rate/Dose/Volume Action  10/29/19 0757  500 mL Given  0757  500 mL Given  Radial Cocktail/Verapamil only (mL) Total volume: 10 mL  Date/Time   Rate/Dose/Volume Action  10/29/19 0757  10 mL Given  heparin injection (Units) Total dose: 4,500 Units  Date/Time   Rate/Dose/Volume Action  10/29/19 0811  4,500 Units Given  iohexol (OMNIPAQUE) 350 MG/ML injection (mL) Total volume: 50 mL  Date/Time   Rate/Dose/Volume Action  10/29/19 0826  50 mL Given  Heparin (Porcine) in NaCl 1000-0.9 UT/500ML-% SOLN (mL) Total volume: 500 mL  Date/Time   Rate/Dose/Volume Action  10/29/19 0820  500 mL Given     Sedation Time Sedation Time Physician-1: 33 minutes 39 seconds        Contrast Medication Name Total Dose  iohexol (OMNIPAQUE) 350 MG/ML injection 50 mL     Radiation/Fluoro Fluoro time: 5.2 (min)  DAP: 12.6 (Gycm2)  Cumulative Air Kerma: 241.1 (mGy)        Coronary Findings Diagnostic Dominance: Left  Left Anterior Descending  Prox LAD to Mid LAD lesion 30% stenosed  Prox LAD to Mid LAD lesion is 30% stenosed.   First Diagonal Branch  1st Diag lesion 30% stenosed  1st Diag lesion is 30% stenosed.   Left Circumflex   Second Obtuse Marginal Branch  Vessel is large in size.  Intervention No interventions have been documented.          Right Heart Right Heart Pressures Hemodynamic  findings consistent with pulmonary hypertension. LV EDP is normal.                 Left Heart Left Ventricle LV end diastolic pressure is normal.      Coronary Diagrams Diagnostic Dominance: Left  &&&&&  Intervention      Implants  No implant documentation for this case.      [employer] Images Link to Procedure Log  Show images for CARDIAC CATHETERIZATION Procedure Log     Images on Long Term Storage   Show images for Mackenzie Jensen, Mackenzie Jensen Data   Most Recent Value  Fick Cardiac Output 7.07 L/min  Fick Cardiac Output Index 3.5 (L/min)/BSA  Aortic Mean Gradient 28.32 mmHg  Aortic Peak Gradient 36 mmHg  Aortic Valve Area 1.09  Aortic Value Area Index 0.54 cm2/BSA  RA A Wave 7 mmHg  RA V Wave 6 mmHg  RA Mean 4 mmHg  RV Systolic Pressure 37 mmHg  RV Diastolic Pressure 0 mmHg  RV EDP 6 mmHg  PA Systolic Pressure 38 mmHg  PA Diastolic Pressure 5 mmHg  PA Mean 21 mmHg  PW A Wave 17 mmHg  PW V Wave 11 mmHg  PW Mean 11 mmHg  AO Systolic Pressure Q000111Q mmHg  AO Diastolic Pressure 71 mmHg  AO Mean [id number] mmHg  LV Systolic Pressure 99991111 mmHg  LV Diastolic Pressure 9 mmHg  LV EDP 16 mmHg  AOp Systolic Pressure 0000000 mmHg  AOp Diastolic Pressure 79 mmHg  AOp Mean Pressure 99991111 mmHg  LVp Systolic Pressure Q000111Q mmHg  LVp Diastolic Pressure 9 mmHg  LVp EDP Pressure 17 mmHg  QP/QS 1  TPVR Index 6 HRUI  TSVR Index 29.45 HRUI  PVR SVR Ratio 0.1  TPVR/TSVR Ratio 0.2    ADDENDUM REPORT: 11/14/2019 06:47  CLINICAL DATA:  Severe Aortic Stenosis.  EXAM: Cardiac TAVR CT  TECHNIQUE: The patient was scanned on a [employer]. A 120 kV retrospective scan was triggered in the descending thoracic aorta at 111 HU's. Gantry rotation speed was 250 msecs and collimation was .6 mm. No beta blockade or nitro were given. The 3D data set was reconstructed in 5% intervals of the R-R cycle. Systolic and diastolic phases were analyzed on a dedicated work station using MPR, MIP and VRT modes. The patient received 80 cc of contrast.  FINDINGS: Image quality: Excellent.  Noise artifact is: Limited.  Valve  Morphology: The aortic valve is tricuspid. It is severely calcified and severely thickened. Movement of the [place] is severely restricted. The [place] contains bulky calcifications.  Aortic Valve area: 1.11 cm2  Aortic Valve Calcium score: 1604  Aortic annular dimension:  Phase assessed: 20%  Annular area: 461 mm2  Annular perimeter: 77.6 mm  Max diameter: 26.6 mm  Min diameter: 22.2 mm  Annular and subannular calcification: No annular or sub-annular calcifications.  Optimal coplanar projection: RAO 2 CAU 3  Coronary Artery Height above Annulus:  Left Main: 13.0 mm  Right Coronary: 15.8 mm  Sinus of Valsalva Measurements:  Non-coronary: 28 mm  Right-coronary: 31 mm  Left-coronary: 31 mm  Sinus of Valsalva Height:  Non-coronary: 21.4 mm  Right-coronary: 17.0  Mm  Left-coronary: 20.0  Mm  Sinotubular Junction: 28 mm.  No significant disease.  Ascending Thoracic Aorta: 36 mm.  No significant disease.  Coronary Arteries: CAC score of 886, which is 86th percentile for age- and sex-matched controls. Normal coronary origin. Left dominance. The study  was performed without use of NTG and is insufficient for plaque evaluation.  Cardiac Morphology:  Right Atrium: Right atrial size is within normal limits.  Right Ventricle: The right ventricular cavity is within normal limits.  Left Atrium: Left atrial size is within normal limits with no left atrial appendage filling defect. There is a diverticulum of the IAS in the inferior posterior aspect of the IAS. There is no apparent communication to suggest a PFO.  Left Ventricle: The ventricular cavity size is within normal limits. There are no stigmata of prior infarction. There is no abnormal filling defect. LVEF=75%. No regional wall motion abnormalities.  Pulmonary arteries: The main pulmonary artery is dilated without proximal filling defect.  Pulmonary veins: Normal pulmonary  venous drainage.  Pericardium: Normal thickness. Trivial pericardial effusion. No significant calcium present.  Mitral Valve: The mitral valve is normal structure with mild mitral annular calcification.  Extra-cardiac findings: There is large hiatal hernia. See attached radiology report for non-cardiac structures.  IMPRESSION: 1. Severe calcific arotic stenosis.  2. Annular measurements appropriate for 26 mm Edwards Sapien 3 (12.6% oversizing). No annular or sub-annular calcifications.  3. Sufficient coronary to annulus distance.  4. Optimal Fluoroscopic Angle for Delivery: RAO 2 CAU 3  5. No thrombus in the left atrial appendage.  <CurrentUser>, MD   Electronically Signed   By: Eleonore Chiquito   On: 11/14/2019 06:47   Study Result  CLINICAL DATA:  86 year old female with history of severe aortic stenosis. Preprocedural study prior to potential transcatheter aortic valve replacement (TAVR) procedure.  EXAM: CT ANGIOGRAPHY CHEST, ABDOMEN AND PELVIS  TECHNIQUE: Multidetector CT imaging through the chest, abdomen and pelvis was performed using the standard protocol during bolus administration of intravenous contrast. Multiplanar reconstructed images and MIPs were obtained and reviewed to evaluate the vascular anatomy.  CONTRAST:  159mL OMNIPAQUE IOHEXOL 350 MG/ML SOLN  COMPARISON:  None.  FINDINGS: CTA CHEST FINDINGS  Cardiovascular: Heart size is borderline enlarged. Small amount of pericardial fluid and/or thickening. No associated pericardial calcification. There is aortic atherosclerosis, as well as atherosclerosis of the great vessels of the mediastinum and the coronary arteries, including calcified atherosclerotic plaque in the left anterior descending, left circumflex and right coronary arteries. Severe thickening calcification of the aortic valve.  Mediastinum/Lymph Nodes: No pathologically enlarged mediastinal or hilar lymph nodes.  Large hiatal hernia. No axillary lymphadenopathy.  Lungs/Pleura: Mild linear scarring in the lower lobes of the lungs bilaterally. No acute consolidative airspace disease. No pleural effusions. No suspicious appearing pulmonary nodules or masses are noted.  Musculoskeletal/Soft Tissues: There are no aggressive appearing lytic or blastic lesions noted in the visualized portions of the skeleton.  CTA ABDOMEN AND PELVIS FINDINGS  Hepatobiliary: Low-attenuation lesions in the liver, compatible with simple cysts, largest of which is in segment 2 of the liver measuring 1.2 x 1.8 cm. No other larger more suspicious appearing hepatic lesions. No intra or extrahepatic biliary ductal dilatation. Gallbladder is normal in appearance.  Pancreas: No pancreatic mass. No pancreatic ductal dilatation. No pancreatic or peripancreatic fluid collections or inflammatory changes.  Spleen: Unremarkable.  Adrenals/Urinary Tract: There are 2 nonobstructive calculi in the lower pole collecting system of left kidney measuring up to 9 mm. No suspicious renal lesions. Bilateral adrenal glands are normal in appearance. No hydroureteronephrosis. Urinary bladder is unremarkable in appearance.  Stomach/Bowel: Predominantly intrathoracic stomach within a large hiatal hernia. No suspicious cystic or solid hepatic lesions. No intra or extrahepatic biliary ductal dilatation. Numerous colonic diverticulae are noted, particularly in  the sigmoid colon, without surrounding inflammatory changes to suggest an acute diverticulitis at this time. Normal appendix.  Vascular/Lymphatic: Aortic atherosclerosis, without evidence of aneurysm or dissection noted in the abdominal or pelvic vasculature. Vascular findings and measurements pertinent to potential TAVR procedure, as detailed below. No lymphadenopathy noted in the abdomen or pelvis.  Reproductive: Uterus and ovaries are unremarkable in  appearance.  Other: No significant volume of ascites.  No pneumoperitoneum.  Musculoskeletal: There are no aggressive appearing lytic or blastic lesions noted in the visualized portions of the skeleton.  VASCULAR MEASUREMENTS PERTINENT TO TAVR:  AORTA:  Minimal Aortic Diameter-11 x 11 mm  Severity of Aortic Calcification-severe  RIGHT PELVIS:  Right Common Iliac Artery -  Minimal Diameter-8.7 x 8.3 mm  Tortuosity-moderate  Calcification-moderate  Right External Iliac Artery -  Minimal Diameter-6.8 x 6.1 mm  Tortuosity-moderate  Calcification-minimal  Right Common Femoral Artery -  Minimal Diameter-6.4 x 6.6 mm  Tortuosity-mild  Calcification-mild  LEFT PELVIS:  Left Common Iliac Artery -  Minimal Diameter-8.3 x 5.7 mm  Tortuosity-moderate  Calcification-moderate  Left External Iliac Artery -  Minimal Diameter-6.5 x 6.6 mm  Tortuosity-mild  Calcification-minimal  Left Common Femoral Artery -  Minimal Diameter-6.2 x 6.0 mm  Tortuosity-mild  Calcification-mild  Review of the MIP images confirms the above findings.  IMPRESSION: 1. Vascular findings and measurements pertinent to potential TAVR procedure, as detailed above. 2. Severe thickening calcification of the aortic valve, compatible with the reported clinical history of severe aortic stenosis. 3. Large hiatal hernia with predominantly intrathoracic stomach, which may have implications for monitoring of the upcoming procedure via transesophageal echocardiography. 4. Small amount of pericardial fluid and/or thickening, without pericardial calcification. 5. Nonobstructive calculi are noted within the lower pole collecting system of the left kidney measuring up to 9 mm. 6. Colonic diverticulosis without evidence of acute diverticulitis at this time. 7. Additional incidental findings, as above.   Electronically Signed   By: Vinnie Langton M.D.    On: 11/13/2019 14:21     STS RISK CALCULATOR: Isolated Aortic Valve REplacement: Risk of Mortality: 2.116% Renal Failure: 1.485% Permanent Stroke: 1.642% Prolonged Ventilation: 6.269% DSW Infection: 0.122% Reoperation: 2.921% Morbidity or Mortality: 10.395% Short Length of Stay: 25.959% Long Length of Stay: 5.945%  Impression:  This 86 year old woman has stage D, severe, symptomatic aortic stenosis with New York Heart Association class III symptoms of exertional fatigue and shortness of breath consistent with chronic diastolic congestive heart failure.  I have personally reviewed her 2D echocardiogram, cardiac catheterization, and CTA studies.  Her echocardiogram shows a trileaflet aortic valve with calcified leaflets with restricted mobility.  The mean gradient is 38 mmHg with an aortic valve area of 0.62 cm consistent with severe aortic stenosis.  There is trivial aortic insufficiency.  There is no mitral regurgitation.  Left ventricular systolic function is normal.  Cardiac catheterization shows mild nonobstructive coronary disease.  The mean aortic valve gradient was measured at 28 mmHg with a peak to peak gradient of 36 mmHg.  I think her shortness of breath is multifactorial related to severe aortic stenosis as well as a large hiatal hernia with her entire stomach in the chest compressing along as well as obesity, deconditioning, and some degree of COPD.  The progressive nature of her shortness of breath and fatigue over the past 6 months points to her severe aortic stenosis and I agree that aortic valve replacement is indicated in this patient.  Given her age and comorbid conditions I think transcatheter  aortic valve placement would be the best treatment for her.  Her gated cardiac CTA shows anatomy suitable for transcatheter aortic valve replacement using a SAPIEN 3 valve.  Her abdominal and pelvic CTA shows adequate pelvic vascular anatomy to allow transfemoral insertion.  I  reviewed the echo, cardiac cath, and CTA studies with the patient and her daughter and answered all their questions.  I think that after she recovers from transcatheter aortic valve replacement she would benefit from surgical evaluation for consideration of repair of her massive hiatal hernia and return of the stomach to the abdomen.  The patient and her daughter were counseled at length regarding treatment alternatives for management of severe symptomatic aortic stenosis. The risks and benefits of surgical intervention has been discussed in detail. Long-term prognosis with medical therapy was discussed. Alternative approaches such as conventional surgical aortic valve replacement, transcatheter aortic valve replacement, and palliative medical therapy were compared and contrasted at length. This discussion was placed in the context of the patient's own specific clinical presentation and past medical history. All of their questions have been addressed.   Following the decision to proceed with transcatheter aortic valve replacement, a discussion was held regarding what types of management strategies would be attempted intraoperatively in the event of life-threatening complications, including whether or not the patient would be considered a candidate for the use of cardiopulmonary bypass and/or conversion to open sternotomy for attempted surgical intervention.  Although she is 86 years old she is in relatively good shape and I think she would be a candidate for emergent sternotomy if needed to manage any intraoperative complications.    The patient has been advised of a variety of complications that might develop including but not limited to risks of death, stroke, paravalvular leak, aortic dissection or other major vascular complications, aortic annulus rupture, device embolization, cardiac rupture or perforation, mitral regurgitation, acute myocardial infarction, arrhythmia, heart block or bradycardia  requiring permanent pacemaker placement, congestive heart failure, respiratory failure, renal failure, pneumonia, infection, other late complications related to structural valve deterioration or migration, or other complications that might ultimately cause a temporary or permanent loss of functional independence or other long term morbidity. The patient provides full informed consent for the procedure as described and all questions were answered.     Plan:  She will be scheduled for transfemoral transcatheter aortic valve replacement using a SAPIEN 3 valve on Tuesday, 12/03/2019.  I spent 60 minutes performing this consultation and > 50% of this time was spent face to face counseling and coordinating the care of this patient's severe symptomatic aortic stenosis.      Gaye Pollack, MD 11/20/2019 2:13 PM

## 2019-11-26 ENCOUNTER — Telehealth: Payer: Self-pay | Admitting: Cardiovascular Disease

## 2019-11-26 NOTE — Telephone Encounter (Signed)
Mackenzie Jensen from [facility] is calling to speak with a triage nurse.

## 2019-11-26 NOTE — Telephone Encounter (Signed)
Thank you I agree with plan. Continue plan for PAT/ covid swab Friday and TAVR next week. Let me know if anything changes.

## 2019-11-26 NOTE — Telephone Encounter (Signed)
Spoke with pt re conversation pt had with Clarene Critchley  Pt c/o chest pain when takes a deep breath does not note any at other times  Also pt notes fatigue the patient is scheduled for TAVR on 12/03/19 Informed symptoms maybe coming from  the aortic valve needing repaired .Pt will continue to monitor and will call back if has increase in S/S Will forward to Bonney Leitz PA for review ./cy

## 2019-11-28 NOTE — Progress Notes (Signed)
[place], [place] [address] Phone: [phone] Fax: [phone]  [place], [place] - [address] [place] [address] Phone: [phone] Fax: [phone]      Your procedure is scheduled on Tuesday, April 6th.  Report to [facility] Main Entrance "A" at 7:15 A.M., and check in at the Admitting office.  Call this number if you have problems the morning of surgery:  [phone]  Call [phone] if you have any questions prior to your surgery date Monday-Friday 8am-4pm    Remember:  Do not eat or drink after midnight the night before your surgery     Take these medicines the morning of surgery with A SIP OF WATER   NONE  As of today, STOP taking any Aspirin (unless otherwise instructed by your surgeon) and Aspirin containing products, Aleve, Naproxen, Ibuprofen, Motrin, Advil, Goody's, BC's, all herbal medications, fish oil, and all vitamins.                      Do not wear jewelry, make up, or nail polish            Do not wear lotions, powders, perfumes, or deodorant.            Do not shave 48 hours prior to surgery.              Do not bring valuables to the hospital.            [facility] is not responsible for any belongings or valuables.  Do NOT Smoke (Tobacco/Vapping) or drink Alcohol 24 hours prior to your procedure If you use a CPAP at night, you may bring all equipment for your overnight stay.   Contacts, glasses, dentures or bridgework may not be worn into surgery.      For patients admitted to the hospital, discharge time will be determined by your treatment team.   Patients discharged the day of surgery will not be allowed to drive home, and someone needs to stay with them for 24 hours.    Special instructions:   [facility]- Preparing For Surgery  Before surgery, you can play an important role. Because skin is not sterile, your  skin needs to be as free of germs as possible. You can reduce the number of germs on your skin by washing with CHG (chlorahexidine gluconate) Soap before surgery.  CHG is an antiseptic cleaner which kills germs and bonds with the skin to continue killing germs even after washing.    Oral Hygiene is also important to reduce your risk of infection.  Remember - BRUSH YOUR TEETH THE MORNING OF SURGERY WITH YOUR REGULAR TOOTHPASTE  Please do not use if you have an allergy to CHG or antibacterial soaps. If your skin becomes reddened/irritated stop using the CHG.  Do not shave (including legs and underarms) for at least 48 hours prior to first CHG shower. It is OK to shave your face.  Please follow these instructions carefully.   1. Shower the NIGHT BEFORE SURGERY and the MORNING OF SURGERY with CHG Soap.   2. If you chose to wash your hair, wash your hair first as usual with your normal shampoo.  3. After you shampoo, rinse your hair and body thoroughly to remove the shampoo.  4. Use CHG as you would any other liquid soap. You can apply CHG directly to the skin and  wash gently with a scrungie or a clean washcloth.   5. Apply the CHG Soap to your body ONLY FROM THE NECK DOWN.  Do not use on open wounds or open sores. Avoid contact with your eyes, ears, mouth and genitals (private parts). Wash Face and genitals (private parts)  with your normal soap.   6. Wash thoroughly, paying special attention to the area where your surgery will be performed.  7. Thoroughly rinse your body with warm water from the neck down.  8. DO NOT shower/wash with your normal soap after using and rinsing off the CHG Soap.  9. Pat yourself dry with a CLEAN TOWEL.  10. Wear CLEAN PAJAMAS to bed the night before surgery, wear comfortable clothes the morning of surgery  11. Place CLEAN SHEETS on your bed the night of your first shower and DO NOT SLEEP WITH PETS.   Day of Surgery:   Do not apply any deodorants/lotions.   Please wear clean clothes to the hospital/surgery center.   Remember to brush your teeth WITH YOUR REGULAR TOOTHPASTE.   Please read over the following fact sheets that you were given.

## 2019-11-29 ENCOUNTER — Ambulatory Visit (HOSPITAL_COMMUNITY)
Admission: RE | Admit: 2019-11-29 | Discharge: 2019-11-29 | Disposition: A | Discharge status: Home / Self Care | Payer: Medicare PPO | Source: Ambulatory Visit | Attending: Cardiovascular Disease | Admitting: Cardiovascular Disease

## 2019-11-29 ENCOUNTER — Other Ambulatory Visit: Payer: Self-pay

## 2019-11-29 ENCOUNTER — Other Ambulatory Visit (HOSPITAL_COMMUNITY)
Admission: RE | Admit: 2019-11-29 | Discharge: 2019-11-29 | Disposition: A | Discharge status: Home / Self Care | Payer: Medicare PPO | Source: Ambulatory Visit | Attending: Cardiovascular Disease | Admitting: Cardiovascular Disease

## 2019-11-29 ENCOUNTER — Encounter (HOSPITAL_COMMUNITY): Payer: Self-pay

## 2019-11-29 ENCOUNTER — Encounter (HOSPITAL_COMMUNITY)
Admission: RE | Admit: 2019-11-29 | Discharge: 2019-11-29 | Disposition: A | Discharge status: Home / Self Care | Payer: Medicare PPO | Source: Ambulatory Visit | Attending: Cardiovascular Disease | Admitting: Cardiovascular Disease

## 2019-11-29 DIAGNOSIS — K449 Diaphragmatic hernia without obstruction or gangrene: Secondary | ICD-10-CM | POA: Insufficient documentation

## 2019-11-29 DIAGNOSIS — I491 Atrial premature depolarization: Secondary | ICD-10-CM | POA: Diagnosis not present

## 2019-11-29 DIAGNOSIS — I350 Nonrheumatic aortic (valve) stenosis: Secondary | ICD-10-CM | POA: Diagnosis not present

## 2019-11-29 DIAGNOSIS — Z20822 Contact with and (suspected) exposure to covid-19: Secondary | ICD-10-CM | POA: Diagnosis not present

## 2019-11-29 HISTORY — DX: Essential (primary) hypertension: I10

## 2019-11-29 HISTORY — DX: Anemia, unspecified: D64.9

## 2019-11-29 HISTORY — DX: Unspecified malignant neoplasm of skin, unspecified: C44.90

## 2019-11-29 LAB — URINALYSIS, ROUTINE W REFLEX MICROSCOPIC
Bilirubin Urine: NEGATIVE
Glucose, UA: NEGATIVE mg/dL
Hgb urine dipstick: NEGATIVE
Ketones, ur: NEGATIVE mg/dL
Nitrite: NEGATIVE
Protein, ur: 30 mg/dL — AB
Specific Gravity, Urine: 1.025 (ref 1.005–1.030)
pH: 5 (ref 5.0–8.0)

## 2019-11-29 LAB — CBC
HCT: 36.6 % (ref 36.0–46.0)
Hemoglobin: 12 g/dL (ref 12.0–15.0)
MCH: 30.6 pg (ref 26.0–34.0)
MCHC: 32.8 g/dL (ref 30.0–36.0)
MCV: 93.4 fL (ref 80.0–100.0)
Platelets: 311 10*3/uL (ref 150–400)
RBC: 3.92 MIL/uL (ref 3.87–5.11)
RDW: 13.2 % (ref 11.5–15.5)
WBC: 8.4 10*3/uL (ref 4.0–10.5)
nRBC: 0 % (ref 0.0–0.2)

## 2019-11-29 LAB — COMPREHENSIVE METABOLIC PANEL
ALT: 14 U/L (ref 0–44)
AST: 16 U/L (ref 15–41)
Albumin: 3 g/dL — ABNORMAL LOW (ref 3.5–5.0)
Alkaline Phosphatase: 43 U/L (ref 38–126)
Anion gap: 11 (ref 5–15)
BUN: 19 mg/dL (ref 8–23)
CO2: 24 mmol/L (ref 22–32)
Calcium: 9.2 mg/dL (ref 8.9–10.3)
Chloride: 101 mmol/L (ref 98–111)
Creatinine, Ser: 0.74 mg/dL (ref 0.44–1.00)
GFR calc Af Amer: >60 mL/min (ref >60)
GFR calc non Af Amer: >60 mL/min (ref >60)
Glucose, Bld: 138 mg/dL — ABNORMAL HIGH (ref 70–99)
Potassium: 4.5 mmol/L (ref 3.5–5.1)
Sodium: 136 mmol/L (ref 135–145)
Total Bilirubin: 0.5 mg/dL (ref 0.3–1.2)
Total Protein: 6.8 g/dL (ref 6.5–8.1)

## 2019-11-29 LAB — APTT: aPTT: 35 seconds (ref 24–36)

## 2019-11-29 LAB — SURGICAL PCR SCREEN
MRSA, PCR: NEGATIVE
Staphylococcus aureus: NEGATIVE

## 2019-11-29 LAB — BRAIN NATRIURETIC PEPTIDE: B Natriuretic Peptide: 184.6 pg/mL — ABNORMAL HIGH (ref 0.0–100.0)

## 2019-11-29 LAB — HEMOGLOBIN A1C
Hgb A1c MFr Bld: 5.9 % — ABNORMAL HIGH (ref 4.8–5.6)
Mean Plasma Glucose: 122.63 mg/dL

## 2019-11-29 LAB — TYPE AND SCREEN
ABO/RH(D): A POS
Antibody Screen: NEGATIVE

## 2019-11-29 LAB — ABO/RH: ABO/RH(D): A POS

## 2019-11-29 LAB — PROTIME-INR
INR: 1.1 (ref 0.8–1.2)
Prothrombin Time: 14.4 seconds (ref 11.4–15.2)

## 2019-11-29 LAB — SARS CORONAVIRUS 2 (TAT 6-24 HRS): SARS Coronavirus 2: NEGATIVE

## 2019-11-29 NOTE — Progress Notes (Addendum)
PCP - Moshe Cipro Cardiologist - Dr. Rayann Heman  Chest x-ray - 11-29-19 EKG - 11-29-19 ECHO - 10-14-19 Cardiac Cath - 10-29-19   COVID TEST- 11-29-19   Anesthesia review: yes, heart history, EKG  Patient denies shortness of breath, fever, cough and chest pain at PAT appointment   All instructions explained to the patient, with a verbal understanding of the material. Patient agrees to go over the instructions while at home for a better understanding. Patient also instructed to self quarantine after being tested for COVID-19. The opportunity to ask questions was provided.

## 2019-12-02 MED ORDER — MAGNESIUM SULFATE 50 % IJ SOLN
40.0000 meq | INTRAMUSCULAR | Status: DC
  Filled 2019-12-02: qty 9.85

## 2019-12-02 MED ORDER — POTASSIUM CHLORIDE 2 MEQ/ML IV SOLN
80.0000 meq | INTRAVENOUS | Status: DC
  Filled 2019-12-02: qty 40

## 2019-12-02 MED ORDER — NOREPINEPHRINE 4 MG/250ML-% IV SOLN
0.0000 ug/min | INTRAVENOUS | Status: AC
  Administered 2019-12-03: 2 ug/min via INTRAVENOUS
  Filled 2019-12-02: qty 250

## 2019-12-02 MED ORDER — VANCOMYCIN HCL 1500 MG/300ML IV SOLN
1500.0000 mg | INTRAVENOUS | Status: AC
  Administered 2019-12-03: 1500 mg via INTRAVENOUS
  Filled 2019-12-02: qty 300

## 2019-12-02 MED ORDER — DEXMEDETOMIDINE HCL IN NACL 400 MCG/100ML IV SOLN
0.1000 ug/kg/h | INTRAVENOUS | Status: AC
  Administered 2019-12-03: 1 ug/kg/h via INTRAVENOUS
  Filled 2019-12-02: qty 100

## 2019-12-02 MED ORDER — SODIUM CHLORIDE 0.9 % IV SOLN
INTRAVENOUS | Status: DC
  Filled 2019-12-02: qty 30

## 2019-12-02 MED ORDER — SODIUM CHLORIDE 0.9 % IV SOLN
1.5000 g | INTRAVENOUS | Status: AC
  Administered 2019-12-03: 1.5 g via INTRAVENOUS
  Filled 2019-12-02: qty 1.5

## 2019-12-02 NOTE — Progress Notes (Signed)
Anesthesia Chart Review:  Case: Mackenzie Jensen Date/Time: 12/03/19 0900   Procedures:      TRANSCATHETER AORTIC VALVE REPLACEMENT, TRANSFEMORAL (N/A )     TRANSESOPHAGEAL ECHOCARDIOGRAM (TEE) (N/A )   Anesthesia type: General   Pre-op diagnosis: Severe Aortic Stenosis   Location: [facility] CATH LAB 6 / [facility] INVASIVE CV LAB   Providers: Sherren Mocha, MD    CT surgeon: Gilford Raid, MD   DISCUSSION: Patient is an 86 year old female scheduled for the above procedure.  History includes former smoker, severe AS, HTN, GERD, hiatal hernia, cancer (vulvar, skin), anemia. BMI is consistent with obesity.  Needs ABG on the day of surgery. 11/29/19 COVID-19 test negative. Anesthesia team to evaluate on the day of surgery.    VS: BP 137/65   Pulse 74   Temp 36.8 C (Oral)   Resp 18   Ht 5\' 6"  (1.676 m)   Wt 92.8 kg   SpO2 97%   BMI 33.01 kg/m    PROVIDERS: Moshe Cipro, MD is PCP Thompson Grayer, MD is cardiologist. She has primarily been seeing Truitt Merle, NP with general cardiology and now Copper, Legrand Como, MD for TAVR.   LABS: Labs reviewed: Acceptable for surgery. (all labs ordered are listed, but only abnormal results are displayed)  Labs Reviewed  BRAIN NATRIURETIC PEPTIDE - Abnormal; Notable for the following components:      Result Value   B Natriuretic Peptide 184.6 (*)    All other components within normal limits  COMPREHENSIVE METABOLIC PANEL - Abnormal; Notable for the following components:   Glucose, Bld 138 (*)    Albumin 3.0 (*)    All other components within normal limits  HEMOGLOBIN A1C - Abnormal; Notable for the following components:   Hgb A1c MFr Bld 5.9 (*)    All other components within normal limits  URINALYSIS, ROUTINE W REFLEX MICROSCOPIC - Abnormal; Notable for the following components:   Color, Urine AMBER (*)    APPearance HAZY (*)    Protein, ur 30 (*)    Leukocytes,Ua TRACE (*)    Bacteria, UA RARE (*)    Non Squamous Epithelial 0-5 (*)    All other  components within normal limits  SURGICAL PCR SCREEN  APTT  CBC  PROTIME-INR  TYPE AND SCREEN  ABO/RH  - 11/29/19 Cardiology notes indicate that she is being treated for UTI per PCP   IMAGES: CXR 11/29/19: IMPRESSION: Enlargement of cardiac silhouette. Large hiatal hernia. LEFT basilar atelectasis.   EKG: 11/29/19: Sinus rhythm with Premature atrial complexes Nonspecific ST and T wave abnormality Abnormal ECG Compared to previous tracing the QRS is narrower and the axis has shifted Confirmed by Lyman Bishop [phone]) on 11/29/2019 9:07:04 PM   CV: Carotid US 11/13/19: Summary:  Right Carotid: Velocities in the right ICA are consistent with a 1-39%  stenosis.  Left Carotid: Velocities in the left ICA are consistent with a 1-39%  stenosis.  Vertebrals: Bilateral vertebral arteries demonstrate antegrade flow.  Subclavians: Normal flow hemodynamics were seen in bilateral subclavian        arteries.    CT Coronary 11/13/19: IMPRESSION: 1. Severe calcific arotic stenosis. 2. Annular measurements appropriate for 26 mm Edwards Sapien 3 (12.6% oversizing). No annular or sub-annular calcifications. 3. Sufficient coronary to annulus distance. 4. Optimal Fluoroscopic Angle for Delivery: RAO 2 CAU 3 5. No thrombus in the left atrial appendage.   Cardiac cath 10/29/19:  Calcific aortic stenosis, moderately severe with peak to peak gradient 36 mmHg and  mean gradient 28 mmHg.  Calculated valve area 1.09 cm  20% distal left main  Irregularities up to 30% in the proximal to mid LAD.  First diagonal contains proximal 50% narrowing.  The circumflex is dominant.  Luminal irregularities are noted.  Nondominant right coronary.  Widely patent vessel.  LVEDP 16 mmHg RECOMMENDATIONS:  Refer to structural heart clinic.    Dyspnea is out of proportion to hemodynamics.  Other considerations include diastolic heart failure in addition to aortic stenosis.   Echo  10/14/19: IMPRESSIONS  1. Left ventricular ejection fraction, by estimation, is 60 to 65%. The  left ventricle has normal function. The left ventricle has no regional  wall motion abnormalities. There is moderate left ventricular hypertrophy.  Left ventricular diastolic  parameters are consistent with Grade I diastolic dysfunction (impaired  relaxation).  2. Right ventricular systolic function is normal. The right ventricular  size is normal. There is normal pulmonary artery systolic pressure. The  estimated right ventricular systolic pressure is [id number] mmHg.  3. Left atrial size was mildly dilated.  4. The mitral valve is normal in structure and function. No evidence of  mitral valve regurgitation. No evidence of mitral stenosis.  5. The aortic valve is tricuspid. Aortic valve regurgitation is trivial.  Severe aortic valve stenosis. Aortic valve area, by VTI measures 0.62 cm.  Aortic valve mean gradient measures 38.0 mmHg.  6. Aortic dilatation noted. There is mild dilatation of the ascending  aorta.  7. The inferior vena cava is normal in size with greater than 50%  respiratory variability, suggesting right atrial pressure of 3 mmHg.    Past Medical History:  Diagnosis Date  . Anemia   . Anxiety   . GERD (gastroesophageal reflux disease)   . Hiatal hernia   . Hypertension   . Insomnia   . Osteopenia   . Right knee DJD   . Skin cancer   . Vulvar cancer [facility])     Past Surgical History:  Procedure Laterality Date  . COSMETIC SURGERY    . RIGHT/LEFT HEART CATH AND CORONARY ANGIOGRAPHY N/A 10/29/2019   Procedure: RIGHT/LEFT HEART CATH AND CORONARY ANGIOGRAPHY;  Surgeon: Belva Crome, MD;  Location: [facility] CV LAB;  Service: Cardiovascular;  Laterality: N/A;  . TONSILLECTOMY AND ADENOIDECTOMY    . TUBAL LIGATION    . VULVA SURGERY      MEDICATIONS: . celecoxib (CELEBREX) 200 MG capsule  . Cholecalciferol (VITAMIN D-3) 125 MCG (5000 UT) TABS  . escitalopram  (LEXAPRO) 10 MG tablet  . esomeprazole (NEXIUM) 40 MG capsule  . ferrous sulfate 325 (65 FE) MG tablet  . metoprolol succinate (TOPROL-XL) 25 MG 24 hr tablet  . Misc Natural Products (OSTEO BI-FLEX JOINT SHIELD PO)  . Multiple Vitamin (MULTIVITAMIN WITH MINERALS) TABS tablet  . Travoprost, BAK Free, (TRAVATAN) 0.004 % SOLN ophthalmic solution   No current facility-administered medications for this encounter.   Derrill Memo ON 12/03/2019] cefUROXime (ZINACEF) 1.5 g in sodium chloride 0.9 % 100 mL IVPB  . [START ON 12/03/2019] dexmedetomidine (PRECEDEX) 400 MCG/100ML (4 mcg/mL) infusion  . [START ON 12/03/2019] heparin 30,000 units/NS 1000 mL solution for CELLSAVER  . [START ON 12/03/2019] magnesium sulfate (IV Push/IM) injection 40 mEq  . [START ON 12/03/2019] norepinephrine (LEVOPHED) 4mg  in 270mL premix infusion  . [START ON 12/03/2019] potassium chloride injection 80 mEq  . [START ON 12/03/2019] vancomycin (VANCOREADY) IVPB 1500 mg/300 mL     Myra Gianotti, PA-C Surgical Short Stay/Anesthesiology [facility] Phone [phone]  [facility] Phone [phone] 12/02/2019 12:08 PM

## 2019-12-02 NOTE — Anesthesia Preprocedure Evaluation (Addendum)
Anesthesia Evaluation  Patient identified by MRN, date of birth, ID band Patient awake    Reviewed: Allergy & Precautions, NPO status , Patient's Chart, lab work & pertinent test results  Airway Mallampati: I  TM Distance: >3 FB Neck ROM: Full    Dental  (+) Teeth Intact, Dental Advisory Given   Pulmonary former smoker,    breath sounds clear to auscultation       Cardiovascular hypertension, Pt. on home beta blockers + Valvular Problems/Murmurs AS  Rhythm:Regular Rate:Normal + Systolic murmurs    Neuro/Psych PSYCHIATRIC DISORDERS Anxiety    GI/Hepatic Neg liver ROS, hiatal hernia, GERD  Medicated,  Endo/Other  negative endocrine ROS  Renal/GU negative Renal ROS     Musculoskeletal  (+) Arthritis ,   Abdominal Normal abdominal exam  (+)   Peds  Hematology   Anesthesia Other Findings   Reproductive/Obstetrics                            Anesthesia Physical Anesthesia Plan  ASA: IV  Anesthesia Plan: MAC   Post-op Pain Management:    Induction: Intravenous  PONV Risk Score and Plan: 0 and Ondansetron and Propofol infusion  Airway Management Planned: Natural Airway and Simple Face Mask  Additional Equipment: Arterial line  Intra-op Plan:   Post-operative Plan:   Informed Consent: I have reviewed the patients History and Physical, chart, labs and discussed the procedure including the risks, benefits and alternatives for the proposed anesthesia with the patient or authorized representative who has indicated his/her understanding and acceptance.       Plan Discussed with: CRNA  Anesthesia Plan Comments: (PAT note written 12/02/2019 by Myra Gianotti, PA-C. )       Anesthesia Quick Evaluation

## 2019-12-02 NOTE — H&P (Signed)
[address]       [place] [address]             [phone]      Cardiothoracic Surgery Admission History and Physical   Referring Provider is Belva Crome, MD  Primary Cardiologist is No primary care provider on file.  PCP is Moshe Cipro, MD      Chief Complaint  Patient presents with  . Aortic Stenosis       HPI:  The patient is an 86 year old woman with a history of hiatal hernia and GERD, anxiety, insomnia, and aortic stenosis who reports at least a 5-year history of some exertional fatigue and shortness of breath. She said this is progressed significantly over the past 6 months with shortness of breath on exertion occurring on level ground or climbing 1 flight of stairs as well as doing normal daily activities in her house. Her daughter is with her today and said that her main activity for the week is going to the beauty parlor to have her hair done and that wears her out. She has had no chest pain or pressure. She has had some dizziness with bending over but no syncope. She denies orthopnea. She does report some postprandial tightness in her chest and shortness of breath. She had an echocardiogram in 2015 which showed a trileaflet aortic valve with trivial regurgitation and no stenosis. A repeat echocardiogram in March 2018 showed mildly calcified aortic valve leaflets with an increase in the mean gradient to 13 mmHg consistent with mild aortic stenosis. Subsequent echoes have shown a progressive increase in the mean gradient across her aortic valve. It was noted to be 37 mmHg in June 2020 and was 92mmHg on her most recent echo dated 10/14/2019. Aortic valve area by VTI is measured at 0.62 cm. Left ventricular ejection fraction is 60 to 65% with moderate LVH and grade 1 diastolic dysfunction.   Patient did have a pulmonary work-up in 2018 including pulmonary function test which showed mild COPD. She quit smoking about 40 years ago.       Past Medical  History:  Diagnosis Date  . Anxiety   . GERD (gastroesophageal reflux disease)   . Hiatal hernia   . Insomnia   . Osteopenia   . Right knee DJD   . Vulvar cancer [facility])         Past Surgical History:  Procedure Laterality Date  . COSMETIC SURGERY    . RIGHT/LEFT HEART CATH AND CORONARY ANGIOGRAPHY N/A 10/29/2019   Procedure: RIGHT/LEFT HEART CATH AND CORONARY ANGIOGRAPHY; Surgeon: Belva Crome, MD; Location: [facility]; Service: Cardiovascular; Laterality: N/A;  . TONSILLECTOMY AND ADENOIDECTOMY    . TUBAL LIGATION    . VULVA SURGERY          Family History  Problem Relation Age of Onset  . CAD Father 35  . CAD Brother 45   Social History        Socioeconomic History  . Marital status: Married    Spouse name: Not on file  . Number of children: 2  . Years of education: Not on file  . Highest education level: Not on file  Occupational History  . Not on file  Tobacco Use  . Smoking status: Former Smoker    Packs/day: 1.00    Years: 30.00    Pack years: 30.00    Types: Cigarettes  . Smokeless tobacco: Never Used  . Tobacco comment: Quit  30 years ago  Substance and Sexual Activity  . Alcohol use: No  . Drug use: No  . Sexual activity: Not on file  Other Topics Concern  . Not on file  Social History Narrative   Lives with husband.    Social Determinants of Health      Financial Resource Strain:   . Difficulty of Paying Living Expenses:   Food Insecurity:   . Worried About Charity fundraiser in the Last Year:   . Arboriculturist in the Last Year:   Transportation Needs:   . Film/video editor (Medical):   Marland Kitchen Lack of Transportation (Non-Medical):   Physical Activity:   . Days of Exercise per Week:   . Minutes of Exercise per Session:   Stress:   . Feeling of Stress :   Social Connections:   . Frequency of Communication with Friends and Family:   . Frequency of Social Gatherings with Friends and Family:   . Attends Religious Services:   .  Active Member of Clubs or Organizations:   . Attends Archivist Meetings:   Marland Kitchen Marital Status:   Intimate Partner Violence:   . Fear of Current or Ex-Partner:   . Emotionally Abused:   Marland Kitchen Physically Abused:   . Sexually Abused:          Current Outpatient Medications  Medication Sig Dispense Refill  . celecoxib (CELEBREX) 200 MG capsule Take 200 mg by mouth daily.     . Cholecalciferol (VITAMIN D-3) 125 MCG (5000 UT) TABS Take 5,000 Units by mouth daily.    Marland Kitchen escitalopram (LEXAPRO) 10 MG tablet Take 10 mg by mouth daily.     Marland Kitchen esomeprazole (NEXIUM) 40 MG capsule TAKE (1) CAPSULE TWICE DAILY (TAKE ON AN EMPTY STOMACH AT LEAST 30MIN- UTES BEFORE MEALS). 60 capsule 0  . ferrous sulfate 325 (65 FE) MG tablet Take 325 mg by mouth daily with breakfast.    . metoprolol succinate (TOPROL-XL) 25 MG 24 hr tablet Take 25 mg by mouth daily.    . Misc Natural Products (OSTEO BI-FLEX JOINT SHIELD PO) Take 1 tablet by mouth daily.    . Multiple Vitamin (MULTIVITAMIN WITH MINERALS) TABS tablet Take 1 tablet by mouth daily. VitaFusion Women's Multivitamin    . Travoprost, BAK Free, (TRAVATAN) 0.004 % SOLN ophthalmic solution Place 1 drop into both eyes at bedtime.     . tretinoin (RETIN-A) 0.025 % cream Apply 1 application topically at bedtime as needed (wrinkles).      No current facility-administered medications for this visit.       Allergies  Allergen Reactions  . Dorzolamide Hcl-Timolol Mal Shortness Of Breath  . Novocain [Procaine] Shortness Of Breath and Other (See Comments)   Review of Systems:   General: normal appetite, + decreased energy, no weight gain, no weight loss, no fever  Cardiac: no chest pain with exertion, no chest pain at rest, +SOB with mild exertion, no resting SOB, no PND, no orthopnea, no palpitations, no arrhythmia, no atrial fibrillation, + LE edema, + dizzy spells, no syncope  Respiratory: + exertional and postprandial shortness of breath, no home oxygen, no  productive cough, no dry cough, no bronchitis, + wheezing, no hemoptysis, no asthma, no pain with inspiration or cough, no sleep apnea, no CPAP at night  GI: no difficulty swallowing, + reflux, + frequent heartburn, + hiatal hernia, no abdominal pain, no constipation, no diarrhea, no hematochezia, no hematemesis, no melena  GU: no dysuria, +  frequency, no urinary tract infection, no hematuria, no kidney stones, no kidney disease  Vascular: no pain suggestive of claudication, no pain in feet, no leg cramps, no varicose veins, no DVT, no non-healing foot ulcer  Neuro: no stroke, no TIA's, no seizures, no headaches, no temporary blindness one eye, no slurred speech, no peripheral neuropathy, no chronic pain, no instability of gait, no memory/cognitive dysfunction  Musculoskeletal: + arthritis, + joint swelling, no myalgias, + difficulty walking, + reduced mobility  Skin: no rash, no itching, no skin infections, no pressure sores or ulcerations  Psych: + anxiety, no depression, no nervousness, no unusual recent stress  Eyes: no blurry vision, + floaters, no recent vision changes, + wears glasses or contacts  ENT: + hearing loss, no loose or painful teeth, implants, last saw dentist 6 months ago  Hematologic: + easy bruising, no abnormal bleeding, no clotting disorder, no frequent epistaxis  Endocrine: no diabetes, does not check CBG's at hom    Physical Exam:   BP 109/64 (BP Location: Left Arm, Patient Position: Sitting, Cuff Size: Large)  Pulse 68  Temp (!) 97.3 F (36.3 C)  Resp 16  Ht 5\' 6"  (1.676 m)  Wt 203 lb (92.1 kg)  SpO2 95%  BMI 32.77 kg/m  General: Obese,well-appearing  HEENT: Unremarkable, NCAT, PERLA, EOMI  Neck: no JVD, no bruits, no adenopathy  Chest: clear to auscultation, symmetrical breath sounds, no wheezes, no rhonchi  CV: RRR, grade lll/VI crescendo/decrescendo murmur heard best at RSB, no diastolic murmur  Abdomen: soft, non-tender, no masses  Extremities: warm,  well-perfused, pulses palpable at ankle, no LE edema  Rectal/GU Deferred  Neuro: Grossly non-focal and symmetrical throughout  Skin: Clean and dry, no rashes, no breakdown    Diagnostic Tests:   Patient Name: Mackenzie Jensen Date of Exam: 10/14/2019  Medical Rec #: [id number] Height: 66.0 in  Accession #: [id number] Weight: 209.8 lb  Date of Birth: 1934-07-10 BSA: 2.04 m  Patient Age: 36 years BP: 121/83 mmHg  Patient Gender: F HR: 86 bpm.  Exam Location: [address]   Procedure: 2D Echo, Cardiac Doppler and Color Doppler   Indications: I35.0 Aortic stenosis   History: Patient has prior history of Echocardiogram examinations,  most  recent 02/25/2019. Signs/Symptoms:Chest Pain and Murmur;  Risk  Factors:Dyslipidemia.   Sonographer: Diamond Nickel RCS  Referring Phys: [place]    1. Left ventricular ejection fraction, by estimation, is 60 to 65%. The  left ventricle has normal function. The left ventricle has no regional  wall motion abnormalities. There is moderate left ventricular hypertrophy.  Left ventricular diastolic  parameters are consistent with Grade I diastolic dysfunction (impaired  relaxation).  2. Right ventricular systolic function is normal. The right ventricular  size is normal. There is normal pulmonary artery systolic pressure. The  estimated right ventricular systolic pressure is Q000111Q mmHg.  3. Left atrial size was mildly dilated.  4. The mitral valve is normal in structure and function. No evidence of  mitral valve regurgitation. No evidence of mitral stenosis.  5. The aortic valve is tricuspid. Aortic valve regurgitation is trivial.  Severe aortic valve stenosis. Aortic valve area, by VTI measures 0.62 cm.  Aortic valve mean gradient measures 38.0 mmHg.  6. Aortic dilatation noted. There is mild dilatation of the ascending  aorta.  7. The inferior vena cava is normal in size with greater than 50%  respiratory variability,  suggesting right atrial pressure of 3 mmHg.  FINDINGS  Left Ventricle: Left ventricular ejection fraction, by estimation, is 60  to 65%. The left ventricle has normal function. The left ventricle has no  regional wall motion abnormalities. The left ventricular internal cavity  size was normal in size. There is  moderate left ventricular hypertrophy. Left ventricular diastolic  parameters are consistent with Grade I diastolic dysfunction (impaired  relaxation).   Right Ventricle: The right ventricular size is normal. No increase in  right ventricular wall thickness. Right ventricular systolic function is  normal. There is normal pulmonary artery systolic pressure. The tricuspid  regurgitant velocity is 2.31 m/s, and  with an assumed right atrial pressure of 3 mmHg, the estimated right  ventricular systolic pressure is Q000111Q mmHg.   Left Atrium: Left atrial size was mildly dilated.   Right Atrium: Right atrial size was normal in size.   Pericardium: There is no evidence of pericardial effusion.   Mitral Valve: The mitral valve is normal in structure and function. Mild  mitral annular calcification. No evidence of mitral valve regurgitation.  No evidence of mitral valve stenosis.   Tricuspid Valve: The tricuspid valve is normal in structure. Tricuspid  valve regurgitation is trivial.   Aortic Valve: The aortic valve is tricuspid. Aortic valve regurgitation is  trivial. Severe aortic stenosis is present. Aortic valve mean gradient  measures 38.0 mmHg. Aortic valve peak gradient measures 61.8 mmHg. Aortic  valve area, by VTI measures 0.62  cm.   Pulmonic Valve: The pulmonic valve was normal in structure. Pulmonic valve  regurgitation is not visualized.   Aorta: Aortic dilatation noted. There is mild dilatation of the ascending  aorta.   Venous: The inferior vena cava is normal in size with greater than 50%  respiratory variability, suggesting right atrial pressure of 3 mmHg.     IAS/Shunts: No atrial level shunt detected by color flow Doppler.    LEFT VENTRICLE  PLAX 2D  LVIDd: 3.40 cm Diastology  LVIDs: 2.20 cm LV e' lateral: 6.32 cm/s  LV PW: 1.40 cm LV E/e' lateral: 8.9  LV IVS: 1.60 cm LV e' medial: 5.35 cm/s  LVOT diam: 2.00 cm LV E/e' medial: 10.5  LV SV: 63.46 ml  LV SV Index: 14.58  LVOT Area: 3.14 cm    RIGHT VENTRICLE  RV Basal diam: 2.30 cm  RV S prime: 9.08 cm/s  TAPSE (M-mode): 1.9 cm  RVSP: 24.3 mmHg   LEFT ATRIUM Index RIGHT ATRIUM Index  LA diam: 3.00 cm 1.47 cm/m RA Pressure: 3.00 mmHg  LA Vol (A2C): 62.4 ml 30.58 ml/m RA Area: 16.20 cm  LA Vol (A4C): 46.8 ml 22.93 ml/m RA Volume: 38.40 ml 18.82 ml/m  LA Biplane Vol: 55.5 ml 27.19 ml/m  AORTIC VALVE  AV Area (Vmax): 0.70 cm  AV Area (Vmean): 0.69 cm  AV Area (VTI): 0.62 cm  AV Vmax: 393.00 cm/s  AV Vmean: 288.000 cm/s  AV VTI: 1.030 m  AV Peak Grad: 61.8 mmHg  AV Mean Grad: 38.0 mmHg  LVOT Vmax: 87.50 cm/s  LVOT Vmean: 63.300 cm/s  LVOT VTI: 0.202 m  LVOT/AV VTI ratio: 0.20   AORTA  Ao Root diam: 2.80 cm   MITRAL VALVE TRICUSPID VALVE  MV Area (PHT): 3.74 cm TR Peak grad: 21.3 mmHg  MV Decel Time: 203 msec TR Vmax: 231.00 cm/s  MV E velocity: 56.10 cm/s Estimated RAP: 3.00 mmHg  MV A velocity: 83.60 cm/s RVSP: 24.3 mmHg  MV E/A ratio: 0.67  SHUNTS  Systemic VTI: 0.20 m  Systemic Diam: 2.00 cm   Loralie Champagne MD  Electronically signed by Loralie Champagne MD  Signature Date/Time: 10/14/2019/9:33:03 PM  Physicians  Panel Physicians Referring Physician Case Authorizing Physician  Belva Crome, MD (Primary)         Procedures  RIGHT/LEFT HEART CATH AND CORONARY ANGIOGRAPHY     Conclusion  Calcific aortic stenosis, moderately severe with peak to peak gradient 36 mmHg and mean gradient 28 mmHg. Calculated valve area 1.09 cm  20% distal left main  Irregularities up to 30% in the proximal to mid LAD. First diagonal contains proximal 50% narrowing.   The circumflex is dominant. Luminal irregularities are noted.  Nondominant right coronary. Widely patent vessel.  LVEDP 16 mmHg RECOMMENDATIONS:  Refer to structural heart clinic.  Dyspnea is out of proportion to hemodynamics. Other considerations include diastolic heart failure in addition to aortic stenosis.     Recommendations  Antiplatelet/Anticoag No indication for antiplatelet therapy at this time .        Indications  Aortic stenosis, severe [I35.0 (ICD-10-CM)]     Procedural Details  Technical Details The right radial area was sterilely prepped and draped. Intravenous sedation with Versed and fentanyl was administered. 1% Xylocaine was infiltrated to achieve local analgesia. Using real-time vascular ultrasound, a double wall stick with an angiocath was utilized to obtain intra-arterial access. A VUS image was saved for the permanent record.The modified Seldinger technique was used to place a 62F " Slender" sheath in the right radial artery. Weight based heparin was administered. Coronary angiography was done using 5 F catheters. Right coronary angiography was performed with a JR4. Left ventricular hemodymic recordings and angiography was done using the JR 4 catheter and hand injection. Left coronary angiography was performed with a JL 3.5 cm.  Right heart catheterization was performed by exchanging a previously placed antecubital IV angio-cath for a 5 French Slender sheath. 1% Xylocaine was used to locally nesthetize the area around the IV site. The IV catheter was wired using an .018 guidewire. The modified Seldinger technique was used to place the 5 [place] sheath. Double glove technique was used to enhance sterility. After sheath insertion, right heart cath was performed using a 5 French balloon tipped catheter and fluoroscopic guidance. Pressures were recorded in each chamber and in the pulmonary capillary wedge position.. The main pulmonary artery O2 saturation was sampled.    Hemostasis was achieved using a pneumatic band.  During this procedure the patient is administered a total of Versed 2 mg and Fentanyl 50 mg to achieve and maintain moderate conscious sedation. The patient's heart rate, blood pressure, and oxygen saturation are monitored continuously during the procedure. The period of conscious sedation is 35 minutes, of which I was present face-to-face 100% of this time. Estimated blood loss <50 mL.   During this procedure medications were administered to achieve and maintain moderate conscious sedation while the patient's heart rate, blood pressure, and oxygen saturation were continuously monitored and I was present face-to-face 100% of this time.     Medications  (Filter: Administrations occurring from 10/29/19 0714 to 10/29/19 0827)  Continuous medications are totaled by the amount administered until 10/29/19 0827.  midazolam (VERSED) injection (mg)  Total dose: 2 mg  Date/Time   Rate/Dose/Volume Action  10/29/19 0747  1 mg Given  0753  1 mg Given  fentaNYL (SUBLIMAZE) injection (mcg)  Total dose: 50 mcg  Date/Time   Rate/Dose/Volume Action  10/29/19 0747  25 mcg Given  0753  25 mcg Given  0.9 % sodium chloride infusion (mL/hr)  Total dose: Cannot be calculated* Dosing weight: 93  *Continuous medication not stopped within the calculation time range.  Date/Time   Rate/Dose/Volume Action  10/29/19 0748  50 mL/hr New Bag/Given  lidocaine (PF) (XYLOCAINE) 1 % injection (mL)  Total volume: 4 mL  Date/Time   Rate/Dose/Volume Action  10/29/19 0755  2 mL Given  0756  2 mL Given  Heparin (Porcine) in NaCl 1000-0.9 UT/500ML-% SOLN (mL)  Total volume: 1,000 mL  Date/Time   Rate/Dose/Volume Action  10/29/19 0757  500 mL Given  0757  500 mL Given  Radial Cocktail/Verapamil only (mL)  Total volume: 10 mL  Date/Time   Rate/Dose/Volume Action  10/29/19 0757  10 mL Given  heparin injection (Units)  Total dose: 4,500 Units  Date/Time    Rate/Dose/Volume Action  10/29/19 0811  4,500 Units Given  iohexol (OMNIPAQUE) 350 MG/ML injection (mL)  Total volume: 50 mL  Date/Time   Rate/Dose/Volume Action  10/29/19 0826  50 mL Given  Heparin (Porcine) in NaCl 1000-0.9 UT/500ML-% SOLN (mL)  Total volume: 500 mL  Date/Time   Rate/Dose/Volume Action  10/29/19 0820  500 mL Given     Sedation Time  Sedation Time Physician-1: 33 minutes 39 seconds        Contrast  Medication Name Total Dose  iohexol (OMNIPAQUE) 350 MG/ML injection 50 mL     Radiation/Fluoro  Fluoro time: 5.2 (min)  DAP: 12.6 (Gycm2)  Cumulative Air Kerma: 241.1 (mGy)        Coronary Findings  Diagnostic  Dominance: Left  Left Anterior Descending  Prox LAD to Mid LAD lesion 30% stenosed  Prox LAD to Mid LAD lesion is 30% stenosed.   First Diagonal Branch  1st Diag lesion 30% stenosed  1st Diag lesion is 30% stenosed.   Left Circumflex   Second Obtuse Marginal Branch  Vessel is large in size.  Intervention  No interventions have been documented.          Right Heart  Right Heart Pressures Hemodynamic findings consistent with pulmonary hypertension. LV EDP is normal.                 Left Heart  Left Ventricle LV end diastolic pressure is normal.     Coronary Diagrams  Diagnostic  Dominance: Left  &&&&&  Intervention       Implants  No implant documentation for this case.      Syngo Images Link to Procedure Log  Show images for CARDIAC CATHETERIZATION Procedure Log     Images on Long Term Storage   Show images for Mackenzie Jensen, Mackenzie Jensen Data    Most Recent Value  Fick Cardiac Output 7.07 L/min  Fick Cardiac Output Index 3.5 (L/min)/BSA  Aortic Mean Gradient 28.32 mmHg  Aortic Peak Gradient 36 mmHg  Aortic Valve Area 1.09  Aortic Value Area Index 0.54 cm2/BSA  RA A Wave 7 mmHg  RA V Wave 6 mmHg  RA Mean 4 mmHg  RV Systolic Pressure 37 mmHg  RV Diastolic Pressure 0 mmHg  RV EDP 6 mmHg  PA  Systolic Pressure 38 mmHg  PA Diastolic Pressure 5 mmHg  PA Mean 21 mmHg  PW A Wave 17 mmHg  PW V Wave 11 mmHg  PW Mean 11 mmHg  AO Systolic Pressure Q000111Q mmHg  AO Diastolic Pressure 71 mmHg  AO Mean [id number] mmHg  LV Systolic Pressure 99991111  mmHg  LV Diastolic Pressure 9 mmHg  LV EDP 16 mmHg  AOp Systolic Pressure 0000000 mmHg  AOp Diastolic Pressure 79 mmHg  AOp Mean Pressure 99991111 mmHg  LVp Systolic Pressure Q000111Q mmHg  LVp Diastolic Pressure 9 mmHg  LVp EDP Pressure 17 mmHg  QP/QS 1  TPVR Index 6 HRUI  TSVR Index 29.45 HRUI  PVR SVR Ratio 0.1  TPVR/TSVR Ratio 0.2    ADDENDUM REPORT: 11/14/2019 06:47  CLINICAL DATA: Severe Aortic Stenosis.  EXAM:  Cardiac TAVR CT  TECHNIQUE:  The patient was scanned on a [employer]. A 120 kV  retrospective scan was triggered in the descending thoracic aorta at  111 HU's. Gantry rotation speed was 250 msecs and collimation was .6  mm. No beta blockade or nitro were given. The 3D data set was  reconstructed in 5% intervals of the R-R cycle. Systolic and  diastolic phases were analyzed on a dedicated work station using  MPR, MIP and VRT modes. The patient received 80 cc of contrast.  FINDINGS:  Image quality: Excellent.  Noise artifact is: Limited.  Valve Morphology: The aortic valve is tricuspid. It is severely  calcified and severely thickened. Movement of the [place] is severely  restricted. The [place] contains bulky calcifications.  Aortic Valve area: 1.11 cm2  Aortic Valve Calcium score: 1604  Aortic annular dimension:  Phase assessed: 20%  Annular area: 461 mm2  Annular perimeter: 77.6 mm  Max diameter: 26.6 mm  Min diameter: 22.2 mm  Annular and subannular calcification: No annular or sub-annular  calcifications.  Optimal coplanar projection: RAO 2 CAU 3  Coronary Artery Height above Annulus:  Left Main: 13.0 mm  Right Coronary: 15.8 mm  Sinus of Valsalva Measurements:  Non-coronary: 28 mm  Right-coronary: 31 mm  Left-coronary:  31 mm  Sinus of Valsalva Height:  Non-coronary: 21.4 mm  Right-coronary: 17.0 Mm  Left-coronary: 20.0 Mm  Sinotubular Junction: 28 mm. No significant disease.  Ascending Thoracic Aorta: 36 mm. No significant disease.  Coronary Arteries: CAC score of 886, which is 86th percentile for  age- and sex-matched controls. Normal coronary origin. Left  dominance. The study was performed without use of NTG and is  insufficient for plaque evaluation.  Cardiac Morphology:  Right Atrium: Right atrial size is within normal limits.  Right Ventricle: The right ventricular cavity is within normal  limits.  Left Atrium: Left atrial size is within normal limits with no left  atrial appendage filling defect. There is a diverticulum of the IAS  in the inferior posterior aspect of the IAS. There is no apparent  communication to suggest a PFO.  Left Ventricle: The ventricular cavity size is within normal limits.  There are no stigmata of prior infarction. There is no abnormal  filling defect. LVEF=75%. No regional wall motion abnormalities.  Pulmonary arteries: The main pulmonary artery is dilated without  proximal filling defect.  Pulmonary veins: Normal pulmonary venous drainage.  Pericardium: Normal thickness. Trivial pericardial effusion. No  significant calcium present.  Mitral Valve: The mitral valve is normal structure with mild mitral  annular calcification.  Extra-cardiac findings: There is large hiatal hernia. See attached  radiology report for non-cardiac structures.  IMPRESSION:  1. Severe calcific arotic stenosis.  2. Annular measurements appropriate for 26 mm Edwards Sapien 3  (12.6% oversizing). No annular or sub-annular calcifications.  3. Sufficient coronary to annulus distance.  4. Optimal Fluoroscopic Angle for Delivery: RAO 2 CAU 3  5. No thrombus in the left atrial  appendage.  <CurrentUser>, MD  Electronically Signed  By: Eleonore Chiquito  On: 11/14/2019 06:47  Study Result    CLINICAL DATA: 86 year old female with history of severe aortic  stenosis. Preprocedural study prior to potential transcatheter  aortic valve replacement (TAVR) procedure.  EXAM:  CT ANGIOGRAPHY CHEST, ABDOMEN AND PELVIS  TECHNIQUE:  Multidetector CT imaging through the chest, abdomen and pelvis was  performed using the standard protocol during bolus administration of  intravenous contrast. Multiplanar reconstructed images and MIPs were  obtained and reviewed to evaluate the vascular anatomy.  CONTRAST: 146mL OMNIPAQUE IOHEXOL 350 MG/ML SOLN  COMPARISON: None.  FINDINGS:  CTA CHEST FINDINGS  Cardiovascular: Heart size is borderline enlarged. Small amount of  pericardial fluid and/or thickening. No associated pericardial  calcification. There is aortic atherosclerosis, as well as  atherosclerosis of the great vessels of the mediastinum and the  coronary arteries, including calcified atherosclerotic plaque in the  left anterior descending, left circumflex and right coronary  arteries. Severe thickening calcification of the aortic valve.  Mediastinum/Lymph Nodes: No pathologically enlarged mediastinal or  hilar lymph nodes. Large hiatal hernia. No axillary lymphadenopathy.  Lungs/Pleura: Mild linear scarring in the lower lobes of the lungs  bilaterally. No acute consolidative airspace disease. No pleural  effusions. No suspicious appearing pulmonary nodules or masses are  noted.  Musculoskeletal/Soft Tissues: There are no aggressive appearing  lytic or blastic lesions noted in the visualized portions of the  skeleton.  CTA ABDOMEN AND PELVIS FINDINGS  Hepatobiliary: Low-attenuation lesions in the liver, compatible with  simple cysts, largest of which is in segment 2 of the liver  measuring 1.2 x 1.8 cm. No other larger more suspicious appearing  hepatic lesions. No intra or extrahepatic biliary ductal dilatation.  Gallbladder is normal in appearance.  Pancreas: No pancreatic mass.  No pancreatic ductal dilatation. No  pancreatic or peripancreatic fluid collections or inflammatory  changes.  Spleen: Unremarkable.  Adrenals/Urinary Tract: There are 2 nonobstructive calculi in the  lower pole collecting system of left kidney measuring up to 9 mm. No  suspicious renal lesions. Bilateral adrenal glands are normal in  appearance. No hydroureteronephrosis. Urinary bladder is  unremarkable in appearance.  Stomach/Bowel: Predominantly intrathoracic stomach within a large  hiatal hernia. No suspicious cystic or solid hepatic lesions. No  intra or extrahepatic biliary ductal dilatation. Numerous colonic  diverticulae are noted, particularly in the sigmoid colon, without  surrounding inflammatory changes to suggest an acute diverticulitis  at this time. Normal appendix.  Vascular/Lymphatic: Aortic atherosclerosis, without evidence of  aneurysm or dissection noted in the abdominal or pelvic vasculature.  Vascular findings and measurements pertinent to potential TAVR  procedure, as detailed below. No lymphadenopathy noted in the  abdomen or pelvis.  Reproductive: Uterus and ovaries are unremarkable in appearance.  Other: No significant volume of ascites. No pneumoperitoneum.  Musculoskeletal: There are no aggressive appearing lytic or blastic  lesions noted in the visualized portions of the skeleton.  VASCULAR MEASUREMENTS PERTINENT TO TAVR:  AORTA:  Minimal Aortic Diameter-11 x 11 mm  Severity of Aortic Calcification-severe  RIGHT PELVIS:  Right Common Iliac Artery -  Minimal Diameter-8.7 x 8.3 mm  Tortuosity-moderate  Calcification-moderate  Right External Iliac Artery -  Minimal Diameter-6.8 x 6.1 mm  Tortuosity-moderate  Calcification-minimal  Right Common Femoral Artery -  Minimal Diameter-6.4 x 6.6 mm  Tortuosity-mild  Calcification-mild  LEFT PELVIS:  Left Common Iliac Artery -  Minimal Diameter-8.3 x 5.7 mm  Tortuosity-moderate  Calcification-moderate    Left  External Iliac Artery -  Minimal Diameter-6.5 x 6.6 mm  Tortuosity-mild  Calcification-minimal  Left Common Femoral Artery -  Minimal Diameter-6.2 x 6.0 mm  Tortuosity-mild  Calcification-mild  Review of the MIP images confirms the above findings.  IMPRESSION:  1. Vascular findings and measurements pertinent to potential TAVR  procedure, as detailed above.  2. Severe thickening calcification of the aortic valve, compatible  with the reported clinical history of severe aortic stenosis.  3. Large hiatal hernia with predominantly intrathoracic stomach,  which may have implications for monitoring of the upcoming procedure  via transesophageal echocardiography.  4. Small amount of pericardial fluid and/or thickening, without  pericardial calcification.  5. Nonobstructive calculi are noted within the lower pole collecting  system of the left kidney measuring up to 9 mm.  6. Colonic diverticulosis without evidence of acute diverticulitis  at this time.  7. Additional incidental findings, as above.  Electronically Signed  By: Vinnie Langton M.D.  On: 11/13/2019 14:21     STS RISK CALCULATOR:   Isolated Aortic Valve Replacement:  Risk of Mortality:  2.116%  Renal Failure:  1.485%  Permanent Stroke:  1.642%  Prolonged Ventilation:  6.269%  DSW Infection:  0.122%  Reoperation:  2.921%  Morbidity or Mortality:  10.395%  Short Length of Stay:  25.959%  Long Length of Stay:  5.945%    Impression:   This 86 year old woman has stage D, severe, symptomatic aortic stenosis with New York Heart Association class III symptoms of exertional fatigue and shortness of breath consistent with chronic diastolic congestive heart failure. I have personally reviewed her 2D echocardiogram, cardiac catheterization, and CTA studies. Her echocardiogram shows a trileaflet aortic valve with calcified leaflets with restricted mobility. The mean gradient is 38 mmHg with an aortic valve area of  0.62 cm consistent with severe aortic stenosis. There is trivial aortic insufficiency. There is no mitral regurgitation. Left ventricular systolic function is normal. Cardiac catheterization shows mild nonobstructive coronary disease. The mean aortic valve gradient was measured at 28 mmHg with a peak to peak gradient of 36 mmHg. I think her shortness of breath is multifactorial related to severe aortic stenosis as well as a large hiatal hernia with her entire stomach in the chest compressing along as well as obesity, deconditioning, and some degree of COPD. The progressive nature of her shortness of breath and fatigue over the past 6 months points to her severe aortic stenosis and I agree that aortic valve replacement is indicated in this patient. Given her age and comorbid conditions I think transcatheter aortic valve placement would be the best treatment for her. Her gated cardiac CTA shows anatomy suitable for transcatheter aortic valve replacement using a SAPIEN 3 valve. Her abdominal and pelvic CTA shows adequate pelvic vascular anatomy to allow transfemoral insertion. I reviewed the echo, cardiac cath, and CTA studies with the patient and her daughter and answered all their questions.   I think that after she recovers from transcatheter aortic valve replacement she would benefit from surgical evaluation for consideration of repair of her massive hiatal hernia and return of the stomach to the abdomen.   The patient and her daughter were counseled at length regarding treatment alternatives for management of severe symptomatic aortic stenosis. The risks and benefits of surgical intervention has been discussed in detail. Long-term prognosis with medical therapy was discussed. Alternative approaches such as conventional surgical aortic valve replacement, transcatheter aortic valve replacement, and palliative medical therapy were compared and contrasted at length. This discussion was  placed in the context of  the patient's own specific clinical presentation and past medical history. All of their questions have been addressed.  Following the decision to proceed with transcatheter aortic valve replacement, a discussion was held regarding what types of management strategies would be attempted intraoperatively in the event of life-threatening complications, including whether or not the patient would be considered a candidate for the use of cardiopulmonary bypass and/or conversion to open sternotomy for attempted surgical intervention. Although she is 86 years old she is in relatively good shape and I think she would be a candidate for emergent sternotomy if needed to manage any intraoperative complications.   The patient has been advised of a variety of complications that might develop including but not limited to risks of death, stroke, paravalvular leak, aortic dissection or other major vascular complications, aortic annulus rupture, device embolization, cardiac rupture or perforation, mitral regurgitation, acute myocardial infarction, arrhythmia, heart block or bradycardia requiring permanent pacemaker placement, congestive heart failure, respiratory failure, renal failure, pneumonia, infection, other late complications related to structural valve deterioration or migration, or other complications that might ultimately cause a temporary or permanent loss of functional independence or other long term morbidity. The patient provides full informed consent for the procedure as described and all questions were answered.  Plan:   Transfemoral transcatheter aortic valve replacement using a SAPIEN 3 valve    Gaye Pollack, MD

## 2019-12-03 ENCOUNTER — Inpatient Hospital Stay (HOSPITAL_COMMUNITY): Payer: Medicare PPO

## 2019-12-03 ENCOUNTER — Inpatient Hospital Stay (HOSPITAL_COMMUNITY): Payer: Medicare PPO | Admitting: Certified Registered"

## 2019-12-03 ENCOUNTER — Encounter (HOSPITAL_COMMUNITY)
Admission: RE | Disposition: A | Discharge status: Home / Self Care | Payer: Self-pay | Source: Home / Self Care | Attending: Surgery

## 2019-12-03 ENCOUNTER — Inpatient Hospital Stay (HOSPITAL_COMMUNITY)
Admission: RE | Admit: 2019-12-03 | Discharge: 2019-12-04 | DRG: 266 | Disposition: A | Discharge status: Home / Self Care | Payer: Medicare PPO | Attending: Surgery | Admitting: Surgery

## 2019-12-03 ENCOUNTER — Encounter (HOSPITAL_COMMUNITY): Payer: Self-pay | Admitting: Cardiovascular Disease

## 2019-12-03 ENCOUNTER — Other Ambulatory Visit: Payer: Self-pay

## 2019-12-03 ENCOUNTER — Other Ambulatory Visit: Payer: Self-pay | Admitting: Physician Assistant

## 2019-12-03 DIAGNOSIS — I350 Nonrheumatic aortic (valve) stenosis: Secondary | ICD-10-CM

## 2019-12-03 DIAGNOSIS — K219 Gastro-esophageal reflux disease without esophagitis: Secondary | ICD-10-CM | POA: Diagnosis present

## 2019-12-03 DIAGNOSIS — Z006 Encounter for examination for normal comparison and control in clinical research program: Secondary | ICD-10-CM

## 2019-12-03 DIAGNOSIS — J449 Chronic obstructive pulmonary disease, unspecified: Secondary | ICD-10-CM | POA: Diagnosis present

## 2019-12-03 DIAGNOSIS — M1711 Unilateral primary osteoarthritis, right knee: Secondary | ICD-10-CM | POA: Diagnosis present

## 2019-12-03 DIAGNOSIS — Z954 Presence of other heart-valve replacement: Secondary | ICD-10-CM | POA: Diagnosis not present

## 2019-12-03 DIAGNOSIS — F411 Generalized anxiety disorder: Secondary | ICD-10-CM | POA: Diagnosis present

## 2019-12-03 DIAGNOSIS — E669 Obesity, unspecified: Secondary | ICD-10-CM | POA: Diagnosis present

## 2019-12-03 DIAGNOSIS — Z8249 Family history of ischemic heart disease and other diseases of the circulatory system: Secondary | ICD-10-CM

## 2019-12-03 DIAGNOSIS — Z87891 Personal history of nicotine dependence: Secondary | ICD-10-CM | POA: Diagnosis not present

## 2019-12-03 DIAGNOSIS — M8580 Other specified disorders of bone density and structure, unspecified site: Secondary | ICD-10-CM | POA: Diagnosis present

## 2019-12-03 DIAGNOSIS — I2510 Atherosclerotic heart disease of native coronary artery without angina pectoris: Secondary | ICD-10-CM | POA: Diagnosis present

## 2019-12-03 DIAGNOSIS — Z884 Allergy status to anesthetic agent status: Secondary | ICD-10-CM | POA: Diagnosis not present

## 2019-12-03 DIAGNOSIS — I77810 Thoracic aortic ectasia: Secondary | ICD-10-CM | POA: Diagnosis present

## 2019-12-03 DIAGNOSIS — Z888 Allergy status to other drugs, medicaments and biological substances status: Secondary | ICD-10-CM

## 2019-12-03 DIAGNOSIS — E782 Mixed hyperlipidemia: Secondary | ICD-10-CM | POA: Diagnosis present

## 2019-12-03 DIAGNOSIS — E663 Overweight: Secondary | ICD-10-CM | POA: Diagnosis present

## 2019-12-03 DIAGNOSIS — I5033 Acute on chronic diastolic (congestive) heart failure: Secondary | ICD-10-CM | POA: Diagnosis present

## 2019-12-03 DIAGNOSIS — Z952 Presence of prosthetic heart valve: Secondary | ICD-10-CM

## 2019-12-03 DIAGNOSIS — Z8544 Personal history of malignant neoplasm of other female genital organs: Secondary | ICD-10-CM | POA: Diagnosis not present

## 2019-12-03 DIAGNOSIS — Z6832 Body mass index (BMI) 32.0-32.9, adult: Secondary | ICD-10-CM

## 2019-12-03 DIAGNOSIS — J45909 Unspecified asthma, uncomplicated: Secondary | ICD-10-CM | POA: Diagnosis present

## 2019-12-03 DIAGNOSIS — K449 Diaphragmatic hernia without obstruction or gangrene: Secondary | ICD-10-CM

## 2019-12-03 HISTORY — PX: TRANSCATHETER AORTIC VALVE REPLACEMENT, TRANSFEMORAL: SHX6400

## 2019-12-03 HISTORY — DX: Presence of prosthetic heart valve: Z95.2

## 2019-12-03 HISTORY — PX: TEE WITHOUT CARDIOVERSION: SHX5443

## 2019-12-03 HISTORY — DX: Nonrheumatic aortic (valve) stenosis: I35.0

## 2019-12-03 LAB — POCT I-STAT, CHEM 8
BUN: 11 mg/dL (ref 8–23)
BUN: 12 mg/dL (ref 8–23)
BUN: 11 mg/dL (ref 8–23)
BUN: 11 mg/dL (ref 8–23)
Calcium, Ion: 1.22 mmol/L (ref 1.15–1.40)
Calcium, Ion: 1.24 mmol/L (ref 1.15–1.40)
Calcium, Ion: 1.19 mmol/L (ref 1.15–1.40)
Calcium, Ion: 1.25 mmol/L (ref 1.15–1.40)
Chloride: 98 mmol/L (ref 98–111)
Chloride: 99 mmol/L (ref 98–111)
Chloride: 100 mmol/L (ref 98–111)
Chloride: 99 mmol/L (ref 98–111)
Creatinine, Ser: 0.5 mg/dL (ref 0.44–1.00)
Creatinine, Ser: 0.6 mg/dL (ref 0.44–1.00)
Creatinine, Ser: 0.5 mg/dL (ref 0.44–1.00)
Creatinine, Ser: 0.5 mg/dL (ref 0.44–1.00)
Glucose, Bld: 126 mg/dL — ABNORMAL HIGH (ref 70–99)
Glucose, Bld: 141 mg/dL — ABNORMAL HIGH (ref 70–99)
Glucose, Bld: 146 mg/dL — ABNORMAL HIGH (ref 70–99)
Glucose, Bld: 147 mg/dL — ABNORMAL HIGH (ref 70–99)
HCT: 31 % — ABNORMAL LOW (ref 36.0–46.0)
HCT: 41 % (ref 36.0–46.0)
HCT: 30 % — ABNORMAL LOW (ref 36.0–46.0)
HCT: 32 % — ABNORMAL LOW (ref 36.0–46.0)
Hemoglobin: 10.5 g/dL — ABNORMAL LOW (ref 12.0–15.0)
Hemoglobin: 13.9 g/dL (ref 12.0–15.0)
Hemoglobin: 10.2 g/dL — ABNORMAL LOW (ref 12.0–15.0)
Hemoglobin: 10.9 g/dL — ABNORMAL LOW (ref 12.0–15.0)
Potassium: 3.8 mmol/L (ref 3.5–5.1)
Potassium: 4 mmol/L (ref 3.5–5.1)
Potassium: 4.1 mmol/L (ref 3.5–5.1)
Potassium: 4.1 mmol/L (ref 3.5–5.1)
Sodium: 137 mmol/L (ref 135–145)
Sodium: 138 mmol/L (ref 135–145)
Sodium: 137 mmol/L (ref 135–145)
Sodium: 137 mmol/L (ref 135–145)
TCO2: 30 mmol/L (ref 22–32)
TCO2: 31 mmol/L (ref 22–32)
TCO2: 30 mmol/L (ref 22–32)
TCO2: 30 mmol/L (ref 22–32)

## 2019-12-03 LAB — BLOOD GAS, ARTERIAL
Acid-Base Excess: 2.7 mmol/L — ABNORMAL HIGH (ref 0.0–2.0)
Bicarbonate: 26.5 mmol/L (ref 20.0–28.0)
FIO2: 21
O2 Saturation: 94.4 %
Patient temperature: 37
pCO2 arterial: 38.9 mmHg (ref 32.0–48.0)
pH, Arterial: 7.447 (ref 7.350–7.450)
pO2, Arterial: 72.3 mmHg — ABNORMAL LOW (ref 83.0–108.0)

## 2019-12-03 LAB — POCT ACTIVATED CLOTTING TIME
Activated Clotting Time: 120 seconds
Activated Clotting Time: 131 s
Activated Clotting Time: 263 seconds

## 2019-12-03 SURGERY — IMPLANTATION, AORTIC VALVE, TRANSCATHETER, FEMORAL APPROACH
Anesthesia: Monitor Anesthesia Care

## 2019-12-03 MED ORDER — ONDANSETRON HCL 4 MG/2ML IJ SOLN
INTRAMUSCULAR | Status: DC | PRN
  Administered 2019-12-03: 4 mg via INTRAVENOUS

## 2019-12-03 MED ORDER — SODIUM CHLORIDE 0.9 % IV SOLN: INTRAVENOUS | Status: DC

## 2019-12-03 MED ORDER — LIDOCAINE HCL 1 % IJ SOLN
INTRAMUSCULAR | Status: AC
  Filled 2019-12-03: qty 20

## 2019-12-03 MED ORDER — PROPOFOL 500 MG/50ML IV EMUL
INTRAVENOUS | Status: DC | PRN
  Administered 2019-12-03: 15 ug/kg/min via INTRAVENOUS

## 2019-12-03 MED ORDER — PANTOPRAZOLE SODIUM 40 MG PO TBEC
40.0000 mg | DELAYED_RELEASE_TABLET | Freq: Every day | ORAL | Status: DC
  Administered 2019-12-03 – 2019-12-04 (×2): 40 mg via ORAL
  Filled 2019-12-03 (×2): qty 1

## 2019-12-03 MED ORDER — FENTANYL CITRATE (PF) 100 MCG/2ML IJ SOLN
INTRAMUSCULAR | Status: DC | PRN
  Administered 2019-12-03 (×4): 25 ug via INTRAVENOUS

## 2019-12-03 MED ORDER — HEPARIN (PORCINE) IN NACL 1000-0.9 UT/500ML-% IV SOLN
INTRAVENOUS | Status: DC | PRN
  Administered 2019-12-03: 500 mL

## 2019-12-03 MED ORDER — SODIUM CHLORIDE 0.9 % IV SOLN: INTRAVENOUS | Status: AC

## 2019-12-03 MED ORDER — LACTATED RINGERS IV SOLN: INTRAVENOUS | Status: DC

## 2019-12-03 MED ORDER — SODIUM CHLORIDE 0.9 % IV SOLN
1.5000 g | Freq: Two times a day (BID) | INTRAVENOUS | Status: DC
  Administered 2019-12-03 – 2019-12-04 (×2): 1.5 g via INTRAVENOUS
  Filled 2019-12-03 (×3): qty 1.5

## 2019-12-03 MED ORDER — IOHEXOL 350 MG/ML SOLN
INTRAVENOUS | Status: DC | PRN
  Administered 2019-12-03: 11:00:00 40 mL

## 2019-12-03 MED ORDER — LACTATED RINGERS IV SOLN: INTRAVENOUS | Status: DC | PRN

## 2019-12-03 MED ORDER — MIDAZOLAM HCL 2 MG/2ML IJ SOLN
INTRAMUSCULAR | Status: DC | PRN
  Administered 2019-12-03 (×2): 1 mg via INTRAVENOUS

## 2019-12-03 MED ORDER — SODIUM CHLORIDE 0.9% FLUSH: 3.0000 mL | INTRAVENOUS | Status: DC | PRN

## 2019-12-03 MED ORDER — VANCOMYCIN HCL IN DEXTROSE 1-5 GM/200ML-% IV SOLN
1000.0000 mg | Freq: Once | INTRAVENOUS | Status: AC
  Administered 2019-12-03: 1000 mg via INTRAVENOUS
  Filled 2019-12-03: qty 200

## 2019-12-03 MED ORDER — LIDOCAINE HCL (PF) 1 % IJ SOLN
INTRAMUSCULAR | Status: DC | PRN
  Administered 2019-12-03 (×2): 5 mL

## 2019-12-03 MED ORDER — TRAMADOL HCL 50 MG PO TABS
50.0000 mg | ORAL_TABLET | ORAL | Status: DC | PRN
  Administered 2019-12-04: 50 mg via ORAL
  Filled 2019-12-03: qty 2

## 2019-12-03 MED ORDER — PROTAMINE SULFATE 10 MG/ML IV SOLN
INTRAVENOUS | Status: DC | PRN
  Administered 2019-12-03: 140 mg via INTRAVENOUS

## 2019-12-03 MED ORDER — PHENYLEPHRINE HCL-NACL 20-0.9 MG/250ML-% IV SOLN: 0.0000 ug/min | INTRAVENOUS | Status: DC

## 2019-12-03 MED ORDER — CHLORHEXIDINE GLUCONATE 4 % EX LIQD: 60.0000 mL | Freq: Once | CUTANEOUS | Status: DC

## 2019-12-03 MED ORDER — MORPHINE SULFATE (PF) 2 MG/ML IV SOLN: 1.0000 mg | INTRAVENOUS | Status: DC | PRN

## 2019-12-03 MED ORDER — NITROGLYCERIN IN D5W 200-5 MCG/ML-% IV SOLN: 0.0000 ug/min | INTRAVENOUS | Status: DC

## 2019-12-03 MED ORDER — CHLORHEXIDINE GLUCONATE 0.12 % MT SOLN
OROMUCOSAL | Status: AC
  Administered 2019-12-03: 15 mL via OROMUCOSAL
  Filled 2019-12-03: qty 15

## 2019-12-03 MED ORDER — OXYCODONE HCL 5 MG PO TABS: 5.0000 mg | ORAL_TABLET | ORAL | Status: DC | PRN

## 2019-12-03 MED ORDER — ONDANSETRON HCL 4 MG/2ML IJ SOLN: 4.0000 mg | Freq: Four times a day (QID) | INTRAMUSCULAR | Status: DC | PRN

## 2019-12-03 MED ORDER — HEPARIN (PORCINE) IN NACL 1000-0.9 UT/500ML-% IV SOLN
INTRAVENOUS | Status: AC
  Filled 2019-12-03: qty 500

## 2019-12-03 MED ORDER — SODIUM CHLORIDE 0.9 % IV BOLUS: 500.0000 mL | Freq: Once | INTRAVENOUS | Status: DC

## 2019-12-03 MED ORDER — HEPARIN SODIUM (PORCINE) 1000 UNIT/ML IJ SOLN
INTRAMUSCULAR | Status: DC | PRN
  Administered 2019-12-03: 14000 [IU] via INTRAVENOUS

## 2019-12-03 MED ORDER — CLOPIDOGREL BISULFATE 75 MG PO TABS
75.0000 mg | ORAL_TABLET | Freq: Every day | ORAL | Status: DC
  Administered 2019-12-04: 08:00:00 75 mg via ORAL
  Filled 2019-12-03: qty 1

## 2019-12-03 MED ORDER — ACETAMINOPHEN 325 MG PO TABS
650.0000 mg | ORAL_TABLET | Freq: Four times a day (QID) | ORAL | Status: DC | PRN
  Administered 2019-12-04: 10:00:00 650 mg via ORAL
  Filled 2019-12-03: qty 2

## 2019-12-03 MED ORDER — CHLORHEXIDINE GLUCONATE 0.12 % MT SOLN
15.0000 mL | Freq: Once | OROMUCOSAL | Status: AC
  Filled 2019-12-03: qty 15

## 2019-12-03 MED ORDER — CHLORHEXIDINE GLUCONATE 4 % EX LIQD: 30.0000 mL | CUTANEOUS | Status: DC

## 2019-12-03 MED ORDER — SODIUM CHLORIDE 0.9% FLUSH
3.0000 mL | Freq: Two times a day (BID) | INTRAVENOUS | Status: DC
  Administered 2019-12-03: 3 mL via INTRAVENOUS

## 2019-12-03 MED ORDER — FERROUS SULFATE 325 (65 FE) MG PO TABS
325.0000 mg | ORAL_TABLET | Freq: Every day | ORAL | Status: DC
  Administered 2019-12-04: 08:00:00 325 mg via ORAL
  Filled 2019-12-03: qty 1

## 2019-12-03 MED ORDER — ACETAMINOPHEN 650 MG RE SUPP: 650.0000 mg | Freq: Four times a day (QID) | RECTAL | Status: DC | PRN

## 2019-12-03 MED ORDER — SODIUM CHLORIDE 0.9 % IV SOLN: 250.0000 mL | INTRAVENOUS | Status: DC | PRN

## 2019-12-03 MED ORDER — IOHEXOL 350 MG/ML SOLN
INTRAVENOUS | Status: AC
  Filled 2019-12-03: qty 1

## 2019-12-03 MED ORDER — ESCITALOPRAM OXALATE 10 MG PO TABS
10.0000 mg | ORAL_TABLET | Freq: Every day | ORAL | Status: DC
  Administered 2019-12-03 – 2019-12-04 (×2): 10 mg via ORAL
  Filled 2019-12-03 (×2): qty 1

## 2019-12-03 SURGICAL SUPPLY — 34 items
BAG SNAP BAND KOVER 36X36 (MISCELLANEOUS) ×5 IMPLANT
BLANKET WARM UNDERBOD FULL ACC (MISCELLANEOUS) ×2 IMPLANT
CABLE ADAPT PACING TEMP 12FT (ADAPTER) ×1 IMPLANT
CATH 26 ULTRA DELIVERY (CATHETERS) ×1 IMPLANT
CATH INFINITI 5FR ANG PIGTAIL (CATHETERS) ×2 IMPLANT
CATH INFINITI 6F AL1 (CATHETERS) ×1 IMPLANT
CLOSURE MYNX CONTROL 6F/7F (Vascular Products) ×1 IMPLANT
CRIMPER (MISCELLANEOUS) ×1 IMPLANT
DEVICE CLOSURE PERCLS PRGLD 6F (VASCULAR PRODUCTS) IMPLANT
DEVICE INFLATION ATRION QL2530 (MISCELLANEOUS) ×1 IMPLANT
ELECT DEFIB PAD ADLT CADENCE (PAD) ×1 IMPLANT
GUIDEWIRE SAFE TJ AMPLATZ EXST (WIRE) ×1 IMPLANT
KIT HEART LEFT (KITS) ×2 IMPLANT
KIT MICROPUNCTURE NIT STIFF (SHEATH) ×1 IMPLANT
PACK CARDIAC CATHETERIZATION (CUSTOM PROCEDURE TRAY) ×2 IMPLANT
PERCLOSE PROGLIDE 6F (VASCULAR PRODUCTS) ×4
SHEATH 14X36 EDWARDS (SHEATH) ×1 IMPLANT
SHEATH BRITE TIP 7FR 35CM (SHEATH) ×1 IMPLANT
SHEATH PINNACLE 6F 10CM (SHEATH) ×1 IMPLANT
SHEATH PINNACLE 8F 10CM (SHEATH) ×1 IMPLANT
SHEATH PROBE COVER 6X72 (BAG) ×1 IMPLANT
SHIELD RADPAD SCOOP 12X17 (MISCELLANEOUS) ×1 IMPLANT
SLEEVE REPOSITIONING LENGTH 30 (MISCELLANEOUS) ×1 IMPLANT
STOPCOCK MORSE 400PSI 3WAY (MISCELLANEOUS) ×4 IMPLANT
TRANSDUCER W/STOPCOCK (MISCELLANEOUS) ×4 IMPLANT
TUBE CONN 8.8X1320 FR HP M-F (CONNECTOR) ×1 IMPLANT
TUBING ART PRESS 72  MALE/FEM (TUBING) ×2
TUBING ART PRESS 72 MALE/FEM (TUBING) IMPLANT
TUBING CIL FLEX 10 FLL-RA (TUBING) ×1 IMPLANT
VALVE 26 ULTRA SAPIEN KIT (Valve) ×1 IMPLANT
WIRE AMPLATZ SS-J .035X180CM (WIRE) ×1 IMPLANT
WIRE EMERALD 3MM-J .035X150CM (WIRE) ×1 IMPLANT
WIRE EMERALD 3MM-J .035X260CM (WIRE) ×1 IMPLANT
WIRE EMERALD ST .035X260CM (WIRE) ×1 IMPLANT

## 2019-12-03 NOTE — Progress Notes (Signed)
  [place] VALVE TEAM  Patient doing well s/p TAVR. She is hemodynamically stable, but BP on soft side. Groin sites stable. ECG with sinus no high grade block. When BP more stable plan to send to 4E. Plan for early ambulation after bedrest completed and hopeful discharge over the next 24-48 hours.   Angelena Form PA-C  [facility]  Pager [phone]

## 2019-12-03 NOTE — Interval H&P Note (Signed)
History and Physical Interval Note:  12/03/2019 7:06 AM  Mackenzie Jensen  has presented today for surgery, with the diagnosis of Severe Aortic Stenosis.  The various methods of treatment have been discussed with the patient and family. After consideration of risks, benefits and other options for treatment, the patient has consented to  Procedure(s): TRANSCATHETER AORTIC VALVE REPLACEMENT, TRANSFEMORAL (N/A) TRANSESOPHAGEAL ECHOCARDIOGRAM (TEE) (N/A) as a surgical intervention.  The patient's history has been reviewed, patient examined, no change in status, stable for surgery.  I have reviewed the patient's chart and labs.  Questions were answered to the patient's satisfaction.     Gaye Pollack

## 2019-12-03 NOTE — Discharge Instructions (Signed)
ACTIVITY AND EXERCISE °• Daily activity and exercise are an important part of your recovery. People recover at different rates depending on their general health and type of valve procedure. °• Most people recovering from TAVR feel better relatively quickly  °• No lifting, pushing, pulling more than 10 pounds (examples to avoid: groceries, vacuuming, gardening, golfing): °            - For one week with a procedure through the groin. °            - For six weeks for procedures through the chest wall or neck. °NOTE: You will typically see one of our providers 7-14 days after your procedure to discuss WHEN TO RESUME the above activities.  °  °  °DRIVING °• Do not drive until you are seen for follow up and cleared by a provider. Generally, we ask patient to not drive for 1 week after their procedure. °• If you have been told by your doctor in the past that you may not drive, you must talk with him/her before you begin driving again. °  °DRESSING °• Groin site: you may leave the clear dressing over the site for up to one week or until it falls off. °  °HYGIENE °• If you had a femoral (leg) procedure, you may take a shower when you return home. After the shower, pat the site dry. Do NOT use powder, oils or lotions in your groin area until the site has completely healed. °• If you had a chest procedure, you may shower when you return home unless specifically instructed not to by your discharging practitioner. °            - DO NOT scrub incision; pat dry with a towel. °            - DO NOT apply any lotions, oils, powders to the incision. °            - No tub baths / swimming for at least 2 weeks. °• If you notice any fevers, chills, increased pain, swelling, bleeding or pus, please contact your doctor. °  °ADDITIONAL INFORMATION °• If you are going to have an upcoming dental procedure, please contact our office as you will require antibiotics ahead of time to prevent infection on your heart valve.  ° ° °If you have any  questions or concerns you can call the structural heart phone during normal business hours 8am-4pm. If you have an urgent need after hours or weekends please call [phone] to talk to the on call provider for general cardiology. If you have an emergency that requires immediate attention, please call 911.  ° ° °After TAVR Checklist ° °Check  Test Description  ° Follow up appointment in 1-2 weeks  You will see our structural heart physician assistant, Katie Leeyah Heather. Your incision sites will be checked and you will be cleared to drive and resume all normal activities if you are doing well.    ° 1 month echo and follow up  You will have an echo to check on your new heart valve and be seen back in the office by Katie Orlena Garmon. Many times the echo is not read by your appointment time, but Katie will call you later that day or the following day to report your results.  ° Follow up with your primary cardiologist You will need to be seen by your primary cardiologist in the following 3-6 months after your 1 month appointment in the valve   clinic. Often times your Plavix or Aspirin will be discontinued during this time, but this is decided on a case by case basis.   ° 1 year echo and follow up You will have another echo to check on your heart valve after 1 year and be seen back in the office by Katie Alexx Giambra. This your last structural heart visit.  ° Bacterial endocarditis prophylaxis  You will have to take antibiotics for the rest of your life before all dental procedures (even teeth cleanings) to protect your heart valve. Antibiotics are also required before some surgeries. Please check with your cardiologist before scheduling any surgeries. Also, please make sure to tell us if you have a penicillin allergy as you will require an alternative antibiotic.   ° ° °

## 2019-12-03 NOTE — Transfer of Care (Signed)
Immediate Anesthesia Transfer of Care Note  Patient: Mackenzie Jensen  Procedure(s) Performed: TRANSCATHETER AORTIC VALVE REPLACEMENT, TRANSFEMORAL (N/A ) TRANSESOPHAGEAL ECHOCARDIOGRAM (TEE) (N/A )  Patient Location: PACU  Anesthesia Type:MAC  Level of Consciousness: awake, alert  and oriented  Airway & Oxygen Therapy: Patient Spontanous Breathing and Patient connected to face mask oxygen  Post-op Assessment: Report given to RN and Post -op Vital signs reviewed and stable  Post vital signs: Reviewed and stable  Last Vitals:  Vitals Value Taken Time  BP 124/58 12/03/19 1122  Temp 36.9 C 12/03/19 1107  Pulse 65 12/03/19 1122  Resp 23 12/03/19 1122  SpO2 95 % 12/03/19 1122  Vitals shown include unvalidated device data.  Last Pain:  Vitals:   12/03/19 1107  TempSrc: Temporal  PainSc: Asleep         Complications: No apparent anesthesia complications

## 2019-12-03 NOTE — Anesthesia Procedure Notes (Signed)
Procedure Name: MAC Date/Time: 12/03/2019 9:25 AM Performed by: Alain Marion, CRNA Pre-anesthesia Checklist: Patient identified, Emergency Drugs available, Suction available and Patient being monitored Patient Re-evaluated:Patient Re-evaluated prior to induction Oxygen Delivery Method: Simple face mask Placement Confirmation: positive ETCO2

## 2019-12-03 NOTE — Progress Notes (Addendum)
MOBILITY TEAM - Progress Note   12/03/19 1500  Mobility  Activity Ambulated in room  Level of Assistance Independent after set-up  Assistive Device None  Distance Ambulated (ft) 200 ft  Mobility Response Tolerated well  Mobility performed by Mobility specialist  Bed Position Semi-fowlers   Pre-activity: HR 82, BP 121/66, SpO2 91% RA During activity: HR 98, SpO2 91% RA Post-activity: HR 87, BP 138/81, SpO2 92% RA  Patient declined hallway ambulation secondary to not wanting to wear facemask. Able to ambulate multiple laps in room independently; declined further distance secondary to "I don't want to overdo it." Notes improvement in SOB compared to pre-op.  Mabeline Caras, PT, DPT Mobility Team Pager [phone]

## 2019-12-03 NOTE — Op Note (Signed)
[facility]   MULTIDISCIPLINARY HEART VALVE TEAM   TAVR OPERATIVE NOTE   Date of Procedure:  12/03/2019  Preoperative Diagnosis: Severe Aortic Stenosis   Postoperative Diagnosis: Same   Procedure:    Transcatheter Aortic Valve Replacement - Percutaneous Transfemoral Approach  Edwards Sapien 3 Ultra THV (size 26 mm, model # 9750TFX, serial # [id number])   Co-Surgeons:  Gaye Pollack, MD and Sherren Mocha, MD  Anesthesiologist:  Suella Broad, MD  Echocardiographer:  Sanda Klein, MD  Pre-operative Echo Findings:  Severe aortic stenosis  Normal left ventricular systolic function  Post-operative Echo Findings:  No paravalvular leak  Normal/unchanged left ventricular systolic function  BRIEF CLINICAL NOTE AND INDICATIONS FOR SURGERY  86 year old woman presents today for TAVR.  She has had progressive fatigue and shortness of breath with exertion, New York Heart Association functional class III symptoms of chronic diastolic heart failure.  Multidisciplinary evaluation has demonstrated findings consistent with severe, stage D1 aortic stenosis and she presents today for TAVR in the setting of advanced age and comorbid medical conditions that have been outlined in heart team notes.  During the course of the patient's preoperative work up they have been evaluated comprehensively by a multidisciplinary team of specialists coordinated through the [facility] in the [facility].  They have been demonstrated to suffer from symptomatic severe aortic stenosis as noted above. The patient has been counseled extensively as to the relative risks and benefits of all options for the treatment of severe aortic stenosis including long term medical therapy, conventional surgery for aortic valve replacement, and transcatheter aortic valve replacement.  The patient has been independently evaluated in formal cardiac surgical consultation  by Dr Cyndia Bent, who deemed the patient appropriate for TAVR. Based upon review of all of the patient's preoperative diagnostic tests they are felt to be candidate for transcatheter aortic valve replacement using the transfemoral approach as an alternative to conventional surgery.    Following the decision to proceed with transcatheter aortic valve replacement, a discussion has been held regarding what types of management strategies would be attempted intraoperatively in the event of life-threatening complications, including whether or not the patient would be considered a candidate for the use of cardiopulmonary bypass and/or conversion to open sternotomy for attempted surgical intervention.  The patient has been advised of a variety of complications that might develop peculiar to this approach including but not limited to risks of death, stroke, paravalvular leak, aortic dissection or other major vascular complications, aortic annulus rupture, device embolization, cardiac rupture or perforation, acute myocardial infarction, arrhythmia, heart block or bradycardia requiring permanent pacemaker placement, congestive heart failure, respiratory failure, renal failure, pneumonia, infection, other late complications related to structural valve deterioration or migration, or other complications that might ultimately cause a temporary or permanent loss of functional independence or other long term morbidity.  The patient provides full informed consent for the procedure as described and all questions were answered preoperatively.  DETAILS OF THE OPERATIVE PROCEDURE  PREPARATION:   The patient is brought to the operating room on the above mentioned date and central monitoring was established by the anesthesia team including placement of a central venous catheter and radial arterial line. The patient is placed in the supine position on the operating table.  Intravenous antibiotics are administered. The patient is  monitored closely throughout the procedure under conscious sedation.  Baseline transthoracic echocardiogram is performed. The patient's chest, abdomen, both groins, and both lower extremities are prepared  and draped in a sterile manner. A time out procedure is performed.   PERIPHERAL ACCESS:   Using ultrasound guidance, femoral arterial and venous access is obtained with placement of 6 Fr sheaths on the left side.  A pigtail diagnostic catheter was passed through the femoral arterial sheath under fluoroscopic guidance into the aortic root.  A temporary transvenous pacemaker catheter was passed through the femoral venous sheath under fluoroscopic guidance into the right ventricle.  The pacemaker was tested to ensure stable lead placement and pacemaker capture. Aortic root angiography was performed in order to determine the optimal angiographic angle for valve deployment.  TRANSFEMORAL ACCESS:  A micropuncture technique is used to access the right femoral artery under fluoroscopic and ultrasound guidance.  2 Perclose devices are deployed at 10' and 2' positions to 'PreClose' the femoral artery. An 8 French sheath is placed and then an Amplatz Superstiff wire is advanced through the sheath. This is changed out for a 14 French transfemoral E-Sheath after progressively dilating over the Superstiff wire.  An AL-1 catheter was used to direct a straight-tip exchange length wire across the native aortic valve into the left ventricle. This was exchanged out for a pigtail catheter and position was confirmed in the LV apex. Simultaneous LV and Ao pressures were recorded.  The pigtail catheter was exchanged for an Amplatz Extra-stiff wire in the LV apex.    BALLOON AORTIC VALVULOPLASTY:  Not performed  TRANSCATHETER HEART VALVE DEPLOYMENT:  An Edwards Sapien 3 transcatheter heart valve (size 26 mm) was prepared and crimped per manufacturer's guidelines, and the proper orientation of the valve is confirmed on the  [employer] delivery system. The valve was advanced through the introducer sheath using normal technique until in an appropriate position in the abdominal aorta beyond the sheath tip. The balloon was then retracted and using the fine-tuning wheel was centered on the valve. The valve was then advanced across the aortic arch using appropriate flexion of the catheter. The valve was carefully positioned across the aortic valve annulus. The Commander catheter was retracted using normal technique. Once final position of the valve has been confirmed by angiographic assessment, the valve is deployed while temporarily holding ventilation and during rapid ventricular pacing to maintain systolic blood pressure < 50 mmHg and pulse pressure < 10 mmHg. The balloon inflation is held for >3 seconds after reaching full deployment volume. Once the balloon has fully deflated the balloon is retracted into the ascending aorta and valve function is assessed using echocardiography. The patient's hemodynamic recovery following valve deployment is good.  The deployment balloon and guidewire are both removed. Echo demostrated acceptable post-procedural gradients, stable mitral valve function, and no aortic insufficiency.    PROCEDURE COMPLETION:  The sheath was removed and femoral artery closure is performed using the 2 previously deployed Perclose devices.  Protamine is administered once femoral arterial repair was complete. The site is clear with no evidence of bleeding or hematoma after the sutures are tightened. The temporary pacemaker and pigtail catheters are removed. Mynx closure is used for contralateral femoral arterial hemostasis for the 6 Fr sheath.  The patient tolerated the procedure well and is transported to the surgical intensive care in stable condition. There were no immediate intraoperative complications. All sponge instrument and needle counts are verified correct at completion of the operation.   The  patient received a total of 40 mL of intravenous contrast during the procedure.  Sherren Mocha, MD 12/03/2019 11:26 AM

## 2019-12-03 NOTE — Progress Notes (Signed)
Patient arrived from the cath lab to 4E room 12 after TAVR w/[place].  Right and left groin sites level 0.  Telemetry monitor applied and CCMD notified.  CHG bath completed.  Patient oriented to unit and room to include call light and phone.  Will continue to monitor.

## 2019-12-03 NOTE — Anesthesia Postprocedure Evaluation (Signed)
Anesthesia Post Note  Patient: Mackenzie Jensen  Procedure(s) Performed: TRANSCATHETER AORTIC VALVE REPLACEMENT, TRANSFEMORAL (N/A ) TRANSESOPHAGEAL ECHOCARDIOGRAM (TEE) (N/A )     Patient location during evaluation: PACU Anesthesia Type: MAC Level of consciousness: awake and alert Pain management: pain level controlled Vital Signs Assessment: post-procedure vital signs reviewed and stable Respiratory status: spontaneous breathing, nonlabored ventilation, respiratory function stable and patient connected to nasal cannula oxygen Cardiovascular status: stable and blood pressure returned to baseline Postop Assessment: no apparent nausea or vomiting Anesthetic complications: no    Last Vitals:  Vitals:   12/03/19 1400 12/03/19 1428  BP: (!) 95/48 (!) 88/48  Pulse: (!) 54 (!) 55  Resp: 15 15  Temp:    SpO2: 92% 92%    Last Pain:  Vitals:   12/03/19 1310  TempSrc: Oral  PainSc: 0-No pain                 Effie Berkshire

## 2019-12-03 NOTE — Progress Notes (Signed)
  Echocardiogram 2D Echocardiogram has been performed.  Matilde Bash 12/03/2019, 10:54 AM

## 2019-12-03 NOTE — Op Note (Signed)
[facility]   MULTIDISCIPLINARY HEART VALVE TEAM   TAVR OPERATIVE NOTE   Date of Procedure:  12/03/2019  Preoperative Diagnosis: Severe Aortic Stenosis   Postoperative Diagnosis: Same   Procedure:    Transcatheter Aortic Valve Replacement - Percutaneous Right Transfemoral Approach  Edwards Sapien 3 Ultra THV (size 26 mm, model # 9750TFX, serial # [id number])   Co-Surgeons:  Gaye Pollack, MD and Sherren Mocha, MD   Anesthesiologist:  Wilfrid Lund, MD  Echocardiographer:  Jerilynn Mages. Croituru, MD  Pre-operative Echo Findings:  Severe aortic stenosis  Normal left ventricular systolic function  Post-operative Echo Findings:  No paravalvular leak  Normal left ventricular systolic function   BRIEF CLINICAL NOTE AND INDICATIONS FOR SURGERY  This 86 year old woman has stage D, severe, symptomatic aortic stenosis with New York Heart Association class III symptoms of exertional fatigue and shortness of breath consistent with chronic diastolic congestive heart failure. I have personally reviewed her 2D echocardiogram, cardiac catheterization, and CTA studies. Her echocardiogram shows a trileaflet aortic valve with calcified leaflets with restricted mobility. The mean gradient is 38 mmHg with an aortic valve area of 0.62 cm consistent with severe aortic stenosis. There is trivial aortic insufficiency. There is no mitral regurgitation. Left ventricular systolic function is normal. Cardiac catheterization shows mild nonobstructive coronary disease. The mean aortic valve gradient was measured at 28 mmHg with a peak to peak gradient of 36 mmHg. I think her shortness of breath is multifactorial related to severe aortic stenosis as well as a large hiatal hernia with her entire stomach in the chest compressing along as well as obesity, deconditioning, and some degree of COPD. The progressive nature of her shortness of breath and fatigue over the past 6 months points to her severe aortic  stenosis and I agree that aortic valve replacement is indicated in this patient. Given her age and comorbid conditions I think transcatheter aortic valve placement would be the best treatment for her. Her gated cardiac CTA shows anatomy suitable for transcatheter aortic valve replacement using a SAPIEN 3 valve. Her abdominal and pelvic CTA shows adequate pelvic vascular anatomy to allow transfemoral insertion. I reviewed the echo, cardiac cath, and CTA studies with the patient and her daughter and answered all their questions.   I think that after she recovers from transcatheter aortic valve replacement she would benefit from surgical evaluation for consideration of repair of her massive hiatal hernia and return of the stomach to the abdomen.   The patient and her daughter were counseled at length regarding treatment alternatives for management of severe symptomatic aortic stenosis. The risks and benefits of surgical intervention has been discussed in detail. Long-term prognosis with medical therapy was discussed. Alternative approaches such as conventional surgical aortic valve replacement, transcatheter aortic valve replacement, and palliative medical therapy were compared and contrasted at length. This discussion was placed in the context of the patient's own specific clinical presentation and past medical history. All of their questions have been addressed.  Following the decision to proceed with transcatheter aortic valve replacement, a discussion was held regarding what types of management strategies would be attempted intraoperatively in the event of life-threatening complications, including whether or not the patient would be considered a candidate for the use of cardiopulmonary bypass and/or conversion to open sternotomy for attempted surgical intervention. Although she is 86 years old she is in relatively good shape and I think she would be a candidate for emergent sternotomy if needed to manage any  intraoperative complications.  The patient has been advised of a variety of complications that might develop including but not limited to risks of death, stroke, paravalvular leak, aortic dissection or other major vascular complications, aortic annulus rupture, device embolization, cardiac rupture or perforation, mitral regurgitation, acute myocardial infarction, arrhythmia, heart block or bradycardia requiring permanent pacemaker placement, congestive heart failure, respiratory failure, renal failure, pneumonia, infection, other late complications related to structural valve deterioration or migration, or other complications that might ultimately cause a temporary or permanent loss of functional independence or other long term morbidity. The patient provides full informed consent for the procedure as described and all questions were answered.    DETAILS OF THE OPERATIVE PROCEDURE  PREPARATION:    The patient is brought to the operating room on the above mentioned date and appropriate monitoring was established by the anesthesia team. The patient is placed in the supine position on the operating table.  Intravenous antibiotics are administered. The patient is monitored closely throughout the procedure under conscious sedation.  Baseline transthoracic echocardiogram was performed. The patient's abdomen and both groins are prepared and draped in a sterile manner. A time out procedure is performed.   PERIPHERAL ACCESS:    Using the modified Seldinger technique, femoral arterial and venous access was obtained with placement of 6 Fr sheaths on the left side.  A pigtail diagnostic catheter was passed through the left arterial sheath under fluoroscopic guidance into the aortic root.  A temporary transvenous pacemaker catheter was passed through the left femoral venous sheath under fluoroscopic guidance into the right ventricle.  The pacemaker was tested to ensure stable lead placement and pacemaker  capture. Aortic root angiography was performed in order to determine the optimal angiographic angle for valve deployment.   TRANSFEMORAL ACCESS:   Percutaneous transfemoral access and sheath placement was performed using ultrasound guidance.  The right common femoral artery was cannulated using a micropuncture needle and appropriate location was verified using hand injection angiogram.  A pair of Abbott Perclose percutaneous closure devices were placed and a 6 French sheath replaced into the femoral artery.  The patient was heparinized systemically and ACT verified > 250 seconds.    A 14 Fr transfemoral E-sheath was introduced into the right common femoral artery after progressively dilating over an Amplatz superstiff wire. An AL-1 catheter was used to direct a straight-tip exchange length wire across the native aortic valve into the left ventricle. This was exchanged out for a pigtail catheter and position was confirmed in the LV apex. Simultaneous LV and Ao pressures were recorded.  The pigtail catheter was exchanged for an Amplatz Extra-stiff wire in the LV apex.      BALLOON AORTIC VALVULOPLASTY:   Not performed  TRANSCATHETER HEART VALVE DEPLOYMENT:   An Edwards Sapien 3 Ultra transcatheter heart valve (size 26 mm, model #9750TFX, serial [id number]) was prepared and crimped per manufacturer's guidelines, and the proper orientation of the valve is confirmed on the [employer] delivery system. The valve was advanced through the introducer sheath using normal technique until in an appropriate position in the abdominal aorta beyond the sheath tip. The balloon was then retracted and using the fine-tuning wheel was centered on the valve. The valve was then advanced across the aortic arch using appropriate flexion of the catheter. The valve was carefully positioned across the aortic valve annulus. The Commander catheter was retracted using normal technique. Once final position of the valve has  been confirmed by angiographic assessment, the valve is deployed while temporarily holding ventilation  and during rapid ventricular pacing to maintain systolic blood pressure < 50 mmHg and pulse pressure < 10 mmHg. The balloon inflation is held for >3 seconds after reaching full deployment volume. Once the balloon has fully deflated the balloon is retracted into the ascending aorta and valve function is assessed using echocardiography. There is felt to be no paravalvular leak and no central aortic insufficiency.  The patient's hemodynamic recovery following valve deployment is good.  The deployment balloon and guidewire are both removed.    PROCEDURE COMPLETION:   The sheath was removed and femoral artery closure performed.  Protamine was administered once femoral arterial repair was complete. The temporary pacemaker, pigtail catheters and femoral sheaths were removed with manual pressure used for hemostasis.  A Mynx femoral closure device was utilized following removal of the diagnostic sheath in the left femoral artery.  The patient tolerated the procedure well and is transported to the cath lab recovery area in stable condition. There were no immediate intraoperative complications. All sponge instrument and needle counts are verified correct at completion of the operation.   No blood products were administered during the operation.  The patient received a total of 40 mL of intravenous contrast during the procedure.   Gaye Pollack, MD 12/03/2019 12:56 PM

## 2019-12-03 NOTE — Progress Notes (Signed)
Pt ambulated across her room several times and went to the bathroom without any difficulty.  Pt did not want to go in the hallway at this time but said she would go later.  Pt tolerated ambulation well.  Will continue to monitor.

## 2019-12-04 ENCOUNTER — Inpatient Hospital Stay (HOSPITAL_COMMUNITY): Payer: Medicare PPO

## 2019-12-04 ENCOUNTER — Other Ambulatory Visit (HOSPITAL_COMMUNITY): Payer: Self-pay

## 2019-12-04 DIAGNOSIS — I350 Nonrheumatic aortic (valve) stenosis: Secondary | ICD-10-CM

## 2019-12-04 DIAGNOSIS — Z952 Presence of prosthetic heart valve: Secondary | ICD-10-CM

## 2019-12-04 DIAGNOSIS — Z954 Presence of other heart-valve replacement: Secondary | ICD-10-CM

## 2019-12-04 LAB — CBC
HCT: 32.9 % — ABNORMAL LOW (ref 36.0–46.0)
Hemoglobin: 10.8 g/dL — ABNORMAL LOW (ref 12.0–15.0)
MCH: 30.7 pg (ref 26.0–34.0)
MCHC: 32.8 g/dL (ref 30.0–36.0)
MCV: 93.5 fL (ref 80.0–100.0)
Platelets: 234 10*3/uL (ref 150–400)
RBC: 3.52 MIL/uL — ABNORMAL LOW (ref 3.87–5.11)
RDW: 13.2 % (ref 11.5–15.5)
WBC: 11.8 10*3/uL — ABNORMAL HIGH (ref 4.0–10.5)
nRBC: 0 % (ref 0.0–0.2)

## 2019-12-04 LAB — BASIC METABOLIC PANEL
Anion gap: 11 (ref 5–15)
BUN: 11 mg/dL (ref 8–23)
CO2: 24 mmol/L (ref 22–32)
Calcium: 8.3 mg/dL — ABNORMAL LOW (ref 8.9–10.3)
Chloride: 100 mmol/L (ref 98–111)
Creatinine, Ser: 0.67 mg/dL (ref 0.44–1.00)
GFR calc Af Amer: >60 mL/min (ref >60)
GFR calc non Af Amer: >60 mL/min (ref >60)
Glucose, Bld: 118 mg/dL — ABNORMAL HIGH (ref 70–99)
Potassium: 3.5 mmol/L (ref 3.5–5.1)
Sodium: 135 mmol/L (ref 135–145)

## 2019-12-04 LAB — MAGNESIUM: Magnesium: 1.8 mg/dL (ref 1.7–2.4)

## 2019-12-04 MED ORDER — ASPIRIN EC 81 MG PO TBEC: 81.0000 mg | DELAYED_RELEASE_TABLET | Freq: Every day | ORAL | Status: AC

## 2019-12-04 MED ORDER — CLOPIDOGREL BISULFATE 75 MG PO TABS: 75.0000 mg | ORAL_TABLET | Freq: Every day | ORAL | 1 refills | Status: DC

## 2019-12-04 MED ORDER — PANTOPRAZOLE SODIUM 40 MG PO TBEC: 40.0000 mg | DELAYED_RELEASE_TABLET | Freq: Every day | ORAL | 1 refills | Status: DC

## 2019-12-04 MED FILL — Lidocaine HCl Local Inj 1%: INTRAMUSCULAR | Qty: 20 | Status: AC

## 2019-12-04 MED FILL — Heparin Sod (Porcine)-NaCl IV Soln 1000 Unit/500ML-0.9%: INTRAVENOUS | Qty: 500 | Status: AC

## 2019-12-04 NOTE — Discharge Summary (Signed)
[place] VALVE TEAM  Discharge Summary    Patient ID: Mackenzie Jensen MRN: [id number]; DOB: 1934/07/20  Admit date: 12/03/2019 Discharge date: 12/04/2019  Primary Care Provider: Moshe Cipro, MD  Primary Cardiologist: Dr Rayann Heman / Dr. Burt Knack & Dr. Cyndia Bent (TAVR)  Discharge Diagnoses    Principal Problem:   S/P TAVR (transcatheter aortic valve replacement) Active Problems:   Mixed hyperlipidemia   GERD   Overweight   Generalized anxiety disorder   Reactive airway disease with wheezing   Severe aortic stenosis   Acute on chronic diastolic (congestive) heart failure (HCC)   Hiatal hernia   Allergies Allergies  Allergen Reactions  . Dorzolamide Hcl-Timolol Mal Shortness Of Breath  . Novocain [Procaine] Shortness Of Breath and Other (See Comments)    Diagnostic Studies/Procedures     TAVR OPERATIVE NOTE   Date of Procedure:                12/03/2019  Preoperative Diagnosis:      Severe Aortic Stenosis   Postoperative Diagnosis:    Same   Procedure:        Transcatheter Aortic Valve Replacement - Percutaneous Right Transfemoral Approach             Edwards Sapien 3 Ultra THV (size 26 mm, model # 9750TFX, serial # [id number])              Co-Surgeons:                        Gaye Pollack, MD and Sherren Mocha, MD   Anesthesiologist:                  Wilfrid Lund, MD  Echocardiographer:              Bari Mantis, MD  Pre-operative Echo Findings: ? Severe aortic stenosis ? Normal left ventricular systolic function  Post-operative Echo Findings: ? No paravalvular leak ? Normal left ventricular systolic function _____________   Echo 12/04/19: complete but pending formal read at the time of discharge    History of Present Illness     Mackenzie Jensen is a 86 y.o. female with a history of a large hiatal hernia, GERD, anxiety, insomnia, obesity, and severe aortic stenosis who presented to [facility] on 12/03/19 for planned  TAVR.  She has been followed for aortic stenosis since at least 10/2016. Echocardiogram in March 2018 showed mildly calcified aortic valve leaflets with a mean gradient of 13 mmHg. Subsequent echoes have shown a progressive increase in the mean gradient across her aortic valve. It was noted to be 37 mmHg in June 2020 and was 74mmHg on her most recent echo dated 10/14/2019. Aortic valve area by VTI is measured at 0.62 cm. Left ventricular ejection fraction is 60 to 65% with moderate LVH and grade 1 diastolic dysfunction. She has reported progressive dyspnea on exertion and fatigue over the past 6 months. Diagnostic cath on 10/29/19 showed mild non obst CAD, calcific aortic stenosis, moderately severe with peak to peak gradient 36 mmHg and mean gradient 28 mmHg, AVA 1.09 cm and LVEDP 16.   The patient has been evaluated by the multidisciplinary valve team and felt to have severe, symptomatic aortic stenosis and to be a suitable candidate for TAVR, which was set up for 12/03/19.    Hospital Course     Consultants: none   Severe AS: s/p successful TAVR with a 26 mm Oletta Lamas  Sapien 3 THV via the TF approach on 12/03/19. Post operative echo completed but pending formal read. Groin sites are stable. ECG with sinus and no high grade heart block. Started on Asprin and on plavix x 74months, followed by aspirin indefinitely. I will see in the office next week for close follow up.   Acute on chronic diastolic CHF: as evidenced by an elevated BNP on pre admission lab work and cardiomegaly and mild CHF on CXR. This has been treated with TAVR.   GERD: Nexium was changed to Protonix given potential drug drug interaction with Plavix.   Hiatal hernia: noted on pre TAVR scan. Could also be contributing to get sx. We will let her recover from TAVR. She may benefit from surgical evaluation for consideration of repair of her massive hiatal hernia and return of the stomach to the abdomen. I will discuss this with her in the  outpatient setting.   Arthritis: Celebrex stopped given need for DAPT and increased risk of bleed with combo. Pt not convinced it helped her pain that much anyway.    _____________  Discharge Vitals Blood pressure 134/60, pulse 85, temperature 98.7 F (37.1 C), temperature source Oral, resp. rate 19, height 5\' 6"  (1.676 m), weight 93.4 kg, SpO2 94 %.  Filed Weights   12/03/19 0753 12/03/19 1310 12/04/19 0452  Weight: 93 kg 93 kg 93.4 kg    Labs & Radiologic Studies    CBC Recent Labs    12/03/19 1123 12/04/19 0301  WBC  --  11.8*  HGB 10.2* 10.8*  HCT 30.0* 32.9*  MCV  --  93.5  PLT  --  [id number]   Basic Metabolic Panel Recent Labs    12/03/19 1123 12/04/19 0301  NA 137 135  K 4.1 3.5  CL 100 100  CO2  --  24  GLUCOSE 146* 118*  BUN 11 11  CREATININE 0.50 0.67  CALCIUM  --  8.3*  MG  --  1.8   Liver Function Tests No results for input(s): AST, ALT, ALKPHOS, BILITOT, PROT, ALBUMIN in the last 72 hours. No results for input(s): LIPASE, AMYLASE in the last 72 hours. Cardiac Enzymes No results for input(s): CKTOTAL, CKMB, CKMBINDEX, TROPONINI in the last 72 hours. BNP Invalid input(s): POCBNP D-Dimer No results for input(s): DDIMER in the last 72 hours. Hemoglobin A1C No results for input(s): HGBA1C in the last 72 hours. Fasting Lipid Panel No results for input(s): CHOL, HDL, LDLCALC, TRIG, CHOLHDL, LDLDIRECT in the last 72 hours. Thyroid Function Tests No results for input(s): TSH, T4TOTAL, T3FREE, THYROIDAB in the last 72 hours.  Invalid input(s): FREET3 _____________  DG Chest 2 View  Result Date: 11/29/2019 CLINICAL DATA:  Preoperative evaluation for TAVR, shortness of breath, central chest pain, hypertension, vulvar cancer, former smoker EXAM: CHEST - 2 VIEW COMPARISON:  06/13/2016 FINDINGS: Enlargement of cardiac silhouette. Large hiatal hernia with air-fluid level. Atherosclerotic calcification aorta. Pulmonary vascularity normal. LEFT basilar atelectasis.  No pulmonary infiltrate, pleural effusion or pneumothorax. Bones demineralized with age-indeterminate height loss of T12 vertebral body. IMPRESSION: Enlargement of cardiac silhouette. Large hiatal hernia. LEFT basilar atelectasis. Electronically Signed   By: Lavonia Dana M.D.   On: 11/29/2019 09:02   CT CORONARY MORPH W/CTA COR W/SCORE W/CA W/CM &/OR WO/CM  Addendum Date: 11/14/2019   ADDENDUM REPORT: 11/14/2019 06:47 CLINICAL DATA:  Severe Aortic Stenosis. EXAM: Cardiac TAVR CT TECHNIQUE: The patient was scanned on a [employer]. A 120 kV retrospective scan was triggered in the descending  thoracic aorta at 111 HU's. Gantry rotation speed was 250 msecs and collimation was .6 mm. No beta blockade or nitro were given. The 3D data set was reconstructed in 5% intervals of the R-R cycle. Systolic and diastolic phases were analyzed on a dedicated work station using MPR, MIP and VRT modes. The patient received 80 cc of contrast. FINDINGS: Image quality: Excellent. Noise artifact is: Limited. Valve Morphology: The aortic valve is tricuspid. It is severely calcified and severely thickened. Movement of the [place] is severely restricted. The [place] contains bulky calcifications. Aortic Valve area: 1.11 cm2 Aortic Valve Calcium score: 1604 Aortic annular dimension: Phase assessed: 20% Annular area: 461 mm2 Annular perimeter: 77.6 mm Max diameter: 26.6 mm Min diameter: 22.2 mm Annular and subannular calcification: No annular or sub-annular calcifications. Optimal coplanar projection: RAO 2 CAU 3 Coronary Artery Height above Annulus: Left Main: 13.0 mm Right Coronary: 15.8 mm Sinus of Valsalva Measurements: Non-coronary: 28 mm Right-coronary: 31 mm Left-coronary: 31 mm Sinus of Valsalva Height: Non-coronary: 21.4 mm Right-coronary: 17.0  Mm Left-coronary: 20.0  Mm Sinotubular Junction: 28 mm.  No significant disease. Ascending Thoracic Aorta: 36 mm.  No significant disease. Coronary Arteries: CAC score of 886, which is  86th percentile for age- and sex-matched controls. Normal coronary origin. Left dominance. The study was performed without use of NTG and is insufficient for plaque evaluation. Cardiac Morphology: Right Atrium: Right atrial size is within normal limits. Right Ventricle: The right ventricular cavity is within normal limits. Left Atrium: Left atrial size is within normal limits with no left atrial appendage filling defect. There is a diverticulum of the IAS in the inferior posterior aspect of the IAS. There is no apparent communication to suggest a PFO. Left Ventricle: The ventricular cavity size is within normal limits. There are no stigmata of prior infarction. There is no abnormal filling defect. LVEF=75%. No regional wall motion abnormalities. Pulmonary arteries: The main pulmonary artery is dilated without proximal filling defect. Pulmonary veins: Normal pulmonary venous drainage. Pericardium: Normal thickness. Trivial pericardial effusion. No significant calcium present. Mitral Valve: The mitral valve is normal structure with mild mitral annular calcification. Extra-cardiac findings: There is large hiatal hernia. See attached radiology report for non-cardiac structures. IMPRESSION: 1. Severe calcific arotic stenosis. 2. Annular measurements appropriate for 26 mm Edwards Sapien 3 (12.6% oversizing). No annular or sub-annular calcifications. 3. Sufficient coronary to annulus distance. 4. Optimal Fluoroscopic Angle for Delivery: RAO 2 CAU 3 5. No thrombus in the left atrial appendage. <CurrentUser>, MD Electronically Signed   By: Eleonore Chiquito   On: 11/14/2019 06:47   Result Date: 11/14/2019 EXAM: OVER-READ INTERPRETATION  CT CHEST The following report is an over-read performed by radiologist Dr. Vinnie Langton of [facility], [place] on 11/13/2019. This over-read does not include interpretation of cardiac or coronary anatomy or pathology. The coronary calcium score/coronary CTA interpretation by the  cardiologist is attached. COMPARISON:  No priors. FINDINGS: Extracardiac findings will be described under separate dictation for contemporaneously obtained CTA chest, abdomen and pelvis. IMPRESSION: Please see separate dictation for contemporaneously obtained CTA chest, abdomen and pelvis dated 11/13/2019 for full description of relevant extracardiac findings. Electronically Signed: By: Vinnie Langton M.D. On: 11/13/2019 13:18   DG Chest Port 1 View  Result Date: 12/03/2019 CLINICAL DATA:  Status post transcatheter aortic valve repair. EXAM: PORTABLE CHEST 1 VIEW COMPARISON:  November 29, 2019. FINDINGS: Stable cardiomegaly. Mild central pulmonary vascular congestion is noted. Aortic valve prosthesis is noted. No pneumothorax is noted. Mild left  basilar atelectasis and/or effusion is noted. Bony thorax is unremarkable. IMPRESSION: Stable cardiomegaly and central pulmonary vascular congestion. Mild left basilar atelectasis and/or effusion is noted. Electronically Signed   By: Marijo Conception M.D.   On: 12/03/2019 13:48   CT ANGIO CHEST AORTA W/CM & OR WO/CM  Result Date: 11/13/2019 CLINICAL DATA:  86 year old female with history of severe aortic stenosis. Preprocedural study prior to potential transcatheter aortic valve replacement (TAVR) procedure. EXAM: CT ANGIOGRAPHY CHEST, ABDOMEN AND PELVIS TECHNIQUE: Multidetector CT imaging through the chest, abdomen and pelvis was performed using the standard protocol during bolus administration of intravenous contrast. Multiplanar reconstructed images and MIPs were obtained and reviewed to evaluate the vascular anatomy. CONTRAST:  187mL OMNIPAQUE IOHEXOL 350 MG/ML SOLN COMPARISON:  None. FINDINGS: CTA CHEST FINDINGS Cardiovascular: Heart size is borderline enlarged. Small amount of pericardial fluid and/or thickening. No associated pericardial calcification. There is aortic atherosclerosis, as well as atherosclerosis of the great vessels of the mediastinum and the  coronary arteries, including calcified atherosclerotic plaque in the left anterior descending, left circumflex and right coronary arteries. Severe thickening calcification of the aortic valve. Mediastinum/Lymph Nodes: No pathologically enlarged mediastinal or hilar lymph nodes. Large hiatal hernia. No axillary lymphadenopathy. Lungs/Pleura: Mild linear scarring in the lower lobes of the lungs bilaterally. No acute consolidative airspace disease. No pleural effusions. No suspicious appearing pulmonary nodules or masses are noted. Musculoskeletal/Soft Tissues: There are no aggressive appearing lytic or blastic lesions noted in the visualized portions of the skeleton. CTA ABDOMEN AND PELVIS FINDINGS Hepatobiliary: Low-attenuation lesions in the liver, compatible with simple cysts, largest of which is in segment 2 of the liver measuring 1.2 x 1.8 cm. No other larger more suspicious appearing hepatic lesions. No intra or extrahepatic biliary ductal dilatation. Gallbladder is normal in appearance. Pancreas: No pancreatic mass. No pancreatic ductal dilatation. No pancreatic or peripancreatic fluid collections or inflammatory changes. Spleen: Unremarkable. Adrenals/Urinary Tract: There are 2 nonobstructive calculi in the lower pole collecting system of left kidney measuring up to 9 mm. No suspicious renal lesions. Bilateral adrenal glands are normal in appearance. No hydroureteronephrosis. Urinary bladder is unremarkable in appearance. Stomach/Bowel: Predominantly intrathoracic stomach within a large hiatal hernia. No suspicious cystic or solid hepatic lesions. No intra or extrahepatic biliary ductal dilatation. Numerous colonic diverticulae are noted, particularly in the sigmoid colon, without surrounding inflammatory changes to suggest an acute diverticulitis at this time. Normal appendix. Vascular/Lymphatic: Aortic atherosclerosis, without evidence of aneurysm or dissection noted in the abdominal or pelvic vasculature.  Vascular findings and measurements pertinent to potential TAVR procedure, as detailed below. No lymphadenopathy noted in the abdomen or pelvis. Reproductive: Uterus and ovaries are unremarkable in appearance. Other: No significant volume of ascites.  No pneumoperitoneum. Musculoskeletal: There are no aggressive appearing lytic or blastic lesions noted in the visualized portions of the skeleton. VASCULAR MEASUREMENTS PERTINENT TO TAVR: AORTA: Minimal Aortic Diameter-11 x 11 mm Severity of Aortic Calcification-severe RIGHT PELVIS: Right Common Iliac Artery - Minimal Diameter-8.7 x 8.3 mm Tortuosity-moderate Calcification-moderate Right External Iliac Artery - Minimal Diameter-6.8 x 6.1 mm Tortuosity-moderate Calcification-minimal Right Common Femoral Artery - Minimal Diameter-6.4 x 6.6 mm Tortuosity-mild Calcification-mild LEFT PELVIS: Left Common Iliac Artery - Minimal Diameter-8.3 x 5.7 mm Tortuosity-moderate Calcification-moderate Left External Iliac Artery - Minimal Diameter-6.5 x 6.6 mm Tortuosity-mild Calcification-minimal Left Common Femoral Artery - Minimal Diameter-6.2 x 6.0 mm Tortuosity-mild Calcification-mild Review of the MIP images confirms the above findings. IMPRESSION: 1. Vascular findings and measurements pertinent to potential TAVR procedure, as detailed  above. 2. Severe thickening calcification of the aortic valve, compatible with the reported clinical history of severe aortic stenosis. 3. Large hiatal hernia with predominantly intrathoracic stomach, which may have implications for monitoring of the upcoming procedure via transesophageal echocardiography. 4. Small amount of pericardial fluid and/or thickening, without pericardial calcification. 5. Nonobstructive calculi are noted within the lower pole collecting system of the left kidney measuring up to 9 mm. 6. Colonic diverticulosis without evidence of acute diverticulitis at this time. 7. Additional incidental findings, as above. Electronically  Signed   By: Vinnie Langton M.D.   On: 11/13/2019 14:21   VAS US CAROTID  Result Date: 11/13/2019 Carotid Arterial Duplex Study Indications:   Pre-surgical evaluation. Other Factors: Aortic valve disease. Performing Technologist: Maudry Mayhew MHA, RDMS, RVT, RDCS  Examination Guidelines: A complete evaluation includes B-mode imaging, spectral Doppler, color Doppler, and power Doppler as needed of all accessible portions of each vessel. Bilateral testing is considered an integral part of a complete examination. Limited examinations for reoccurring indications may be performed as noted.  Right Carotid Findings: +----------+--------+--------+--------+-----------------------+--------+           PSV cm/sEDV cm/sStenosisPlaque Description     Comments +----------+--------+--------+--------+-----------------------+--------+ CCA Prox  116     22              smooth and heterogenous         +----------+--------+--------+--------+-----------------------+--------+ CCA Distal61      14                                              +----------+--------+--------+--------+-----------------------+--------+ ICA Prox  80      15                                              +----------+--------+--------+--------+-----------------------+--------+ ICA Distal110     31                                              +----------+--------+--------+--------+-----------------------+--------+ ECA       120     12                                              +----------+--------+--------+--------+-----------------------+--------+ +----------+--------+-------+----------------+-------------------+           PSV cm/sEDV cmsDescribe        Arm Pressure (mmHG) +----------+--------+-------+----------------+-------------------+ Subclavian101            Multiphasic, WNL                    +----------+--------+-------+----------------+-------------------+  +---------+--------+--+--------+--+---------+ VertebralPSV cm/s49EDV cm/s14Antegrade +---------+--------+--+--------+--+---------+  Left Carotid Findings: +----------+--------+--------+--------+-----------------------+--------+           PSV cm/sEDV cm/sStenosisPlaque Description     Comments +----------+--------+--------+--------+-----------------------+--------+ CCA Prox  87      12                                              +----------+--------+--------+--------+-----------------------+--------+  CCA Distal81      13              smooth and heterogenous         +----------+--------+--------+--------+-----------------------+--------+ ICA Prox  89      20              smooth                          +----------+--------+--------+--------+-----------------------+--------+ ICA Distal71      20                                              +----------+--------+--------+--------+-----------------------+--------+ ECA       75      9                                               +----------+--------+--------+--------+-----------------------+--------+ +----------+--------+--------+----------------+-------------------+           PSV cm/sEDV cm/sDescribe        Arm Pressure (mmHG) +----------+--------+--------+----------------+-------------------+ [id number]             Multiphasic, WNL                    +----------+--------+--------+----------------+-------------------+ +---------+--------+--+--------+--+---------+ VertebralPSV cm/s65EDV cm/s15Antegrade +---------+--------+--+--------+--+---------+   Summary: Right Carotid: Velocities in the right ICA are consistent with a 1-39% stenosis. Left Carotid: Velocities in the left ICA are consistent with a 1-39% stenosis. Vertebrals:  Bilateral vertebral arteries demonstrate antegrade flow. Subclavians: Normal flow hemodynamics were seen in bilateral subclavian              arteries. *See table(s) above for  measurements and observations.  Electronically signed by Curt Jews MD on 11/13/2019 at 3:52:38 PM.    Final    ECHOCARDIOGRAM LIMITED  Result Date: 12/03/2019    ECHOCARDIOGRAM LIMITED REPORT   Patient Name:   JAZMINNE MECHLING Date of Exam: 12/03/2019 Medical Rec #:  [id number]     Height:       66.0 in Accession #:    [id number]    Weight:       205.0 lb Date of Birth:  02-05-1934     BSA:          2.021 m Patient Age:    44 years      BP:           168/84 mmHg Patient Gender: F             HR:           74 bpm. Exam Location:  Inpatient Procedure: Limited Echo, Cardiac Doppler and Color Doppler Indications:     Aortic stenosis  History:         Patient has prior history of Echocardiogram examinations, most                  recent 10/14/2019. COPD, Aortic Valve Disease;                  Signs/Symptoms:Shortness of Breath.                  Aortic Valve: 26 mm Edwards Edwards Sapien prosthetic, stented                  (  TAVR) valve is present in the aortic position. Procedure Date:                  12/03/2019.  Sonographer:     Dustin Flock Referring Phys:  [place] Diagnosing Phys: Sanda Klein MD                   PRE-PROCEDURAL FINDINGS:                   Normal left ventricular systolic function. Estimated LVEF                  60-65%.                  There are no regional wall motion abnormalities.                  Severe calcific aortic stenosis. Trileaflet aortic valve.                  Peak aortic valve gradient 63 mm Hg, mean gradient 37 mm Hg.                  Dimensionless obstructive index 0.2, calculated aortic valve                  area is 0.75 cm.                  No aortic insufficiency.                  No pericardial effusion.                   POST-PROCEDURAL FINDINGS:                   Hyperdynamic left ventricular systolic function. Estimated LVEF                  75%.                  There are no regional wall motion abnormalities.                  Well-seated TAVR stent-valve.                   Peak aortic valve gradient 10 mm Hg, mean gradient 6 mm Hg.                  Dimensionless obstructive index 0.69, calculated aortic valve                  area is 2.5 cm.                  There is no aortic insufficiency and no perivalvular leak.                  No mitral insufficiency.                  No pericardial effusion. IMPRESSIONS  1. There is a 26 mm Edwards Edwards Sapien prosthetic (TAVR) valve present in the aortic position. Procedure Date: 12/03/2019. FINDINGS  Aortic Valve: Aortic valve mean gradient measures 6.0 mmHg. Aortic valve peak gradient measures 10.5 mmHg. Aortic valve area, by VTI measures 2.52 cm. There is a 26 mm Edwards Edwards Sapien prosthetic, stented (TAVR) valve present in the aortic position. Procedure Date: 12/03/2019.  LEFT VENTRICLE PLAX 2D LVOT diam:     2.20  cm LV SV:         95 LV SV Index:   47 LVOT Area:     3.80 cm  AORTIC VALVE AV Area (Vmax):    2.60 cm AV Area (Vmean):   2.63 cm AV Area (VTI):     2.52 cm AV Vmax:           162.00 cm/s AV Vmean:          114.000 cm/s AV VTI:            0.375 m AV Peak Grad:      10.5 mmHg AV Mean Grad:      6.0 mmHg LVOT Vmax:         111.00 cm/s LVOT Vmean:        78.800 cm/s LVOT VTI:          0.249 m LVOT/AV VTI ratio: 0.66  SHUNTS Systemic VTI:  0.25 m Systemic Diam: 2.20 cm Sanda Klein MD Electronically signed by Sanda Klein MD Signature Date/Time: 12/03/2019/11:07:08 AM    Final    Structural Heart Procedure  Result Date: 12/03/2019 See surgical note for result.  CT Angio Abd/Pel w/ and/or w/o  Result Date: 11/13/2019 CLINICAL DATA:  86 year old female with history of severe aortic stenosis. Preprocedural study prior to potential transcatheter aortic valve replacement (TAVR) procedure. EXAM: CT ANGIOGRAPHY CHEST, ABDOMEN AND PELVIS TECHNIQUE: Multidetector CT imaging through the chest, abdomen and pelvis was performed using the standard protocol during bolus administration of intravenous  contrast. Multiplanar reconstructed images and MIPs were obtained and reviewed to evaluate the vascular anatomy. CONTRAST:  143mL OMNIPAQUE IOHEXOL 350 MG/ML SOLN COMPARISON:  None. FINDINGS: CTA CHEST FINDINGS Cardiovascular: Heart size is borderline enlarged. Small amount of pericardial fluid and/or thickening. No associated pericardial calcification. There is aortic atherosclerosis, as well as atherosclerosis of the great vessels of the mediastinum and the coronary arteries, including calcified atherosclerotic plaque in the left anterior descending, left circumflex and right coronary arteries. Severe thickening calcification of the aortic valve. Mediastinum/Lymph Nodes: No pathologically enlarged mediastinal or hilar lymph nodes. Large hiatal hernia. No axillary lymphadenopathy. Lungs/Pleura: Mild linear scarring in the lower lobes of the lungs bilaterally. No acute consolidative airspace disease. No pleural effusions. No suspicious appearing pulmonary nodules or masses are noted. Musculoskeletal/Soft Tissues: There are no aggressive appearing lytic or blastic lesions noted in the visualized portions of the skeleton. CTA ABDOMEN AND PELVIS FINDINGS Hepatobiliary: Low-attenuation lesions in the liver, compatible with simple cysts, largest of which is in segment 2 of the liver measuring 1.2 x 1.8 cm. No other larger more suspicious appearing hepatic lesions. No intra or extrahepatic biliary ductal dilatation. Gallbladder is normal in appearance. Pancreas: No pancreatic mass. No pancreatic ductal dilatation. No pancreatic or peripancreatic fluid collections or inflammatory changes. Spleen: Unremarkable. Adrenals/Urinary Tract: There are 2 nonobstructive calculi in the lower pole collecting system of left kidney measuring up to 9 mm. No suspicious renal lesions. Bilateral adrenal glands are normal in appearance. No hydroureteronephrosis. Urinary bladder is unremarkable in appearance. Stomach/Bowel: Predominantly  intrathoracic stomach within a large hiatal hernia. No suspicious cystic or solid hepatic lesions. No intra or extrahepatic biliary ductal dilatation. Numerous colonic diverticulae are noted, particularly in the sigmoid colon, without surrounding inflammatory changes to suggest an acute diverticulitis at this time. Normal appendix. Vascular/Lymphatic: Aortic atherosclerosis, without evidence of aneurysm or dissection noted in the abdominal or pelvic vasculature. Vascular findings and measurements pertinent to potential TAVR procedure, as detailed below. No lymphadenopathy noted  in the abdomen or pelvis. Reproductive: Uterus and ovaries are unremarkable in appearance. Other: No significant volume of ascites.  No pneumoperitoneum. Musculoskeletal: There are no aggressive appearing lytic or blastic lesions noted in the visualized portions of the skeleton. VASCULAR MEASUREMENTS PERTINENT TO TAVR: AORTA: Minimal Aortic Diameter-11 x 11 mm Severity of Aortic Calcification-severe RIGHT PELVIS: Right Common Iliac Artery - Minimal Diameter-8.7 x 8.3 mm Tortuosity-moderate Calcification-moderate Right External Iliac Artery - Minimal Diameter-6.8 x 6.1 mm Tortuosity-moderate Calcification-minimal Right Common Femoral Artery - Minimal Diameter-6.4 x 6.6 mm Tortuosity-mild Calcification-mild LEFT PELVIS: Left Common Iliac Artery - Minimal Diameter-8.3 x 5.7 mm Tortuosity-moderate Calcification-moderate Left External Iliac Artery - Minimal Diameter-6.5 x 6.6 mm Tortuosity-mild Calcification-minimal Left Common Femoral Artery - Minimal Diameter-6.2 x 6.0 mm Tortuosity-mild Calcification-mild Review of the MIP images confirms the above findings. IMPRESSION: 1. Vascular findings and measurements pertinent to potential TAVR procedure, as detailed above. 2. Severe thickening calcification of the aortic valve, compatible with the reported clinical history of severe aortic stenosis. 3. Large hiatal hernia with predominantly  intrathoracic stomach, which may have implications for monitoring of the upcoming procedure via transesophageal echocardiography. 4. Small amount of pericardial fluid and/or thickening, without pericardial calcification. 5. Nonobstructive calculi are noted within the lower pole collecting system of the left kidney measuring up to 9 mm. 6. Colonic diverticulosis without evidence of acute diverticulitis at this time. 7. Additional incidental findings, as above. Electronically Signed   By: Vinnie Langton M.D.   On: 11/13/2019 14:21   Disposition   Pt is being discharged home today in good condition.  Follow-up Plans & Appointments    Follow-up Information    Eileen Stanford, PA-C. Go on 12/12/2019.   Specialties: Cardiology, Radiology Why: @ 1:30pm, please arrive at least 10 minutes early Contact information: [address] [place] [id number] [phone]            Discharge Medications   Allergies as of 12/04/2019      Reactions   Dorzolamide Hcl-timolol Mal Shortness Of Breath   Novocain [procaine] Shortness Of Breath, Other (See Comments)      Medication List    STOP taking these medications   celecoxib 200 MG capsule Commonly known as: CELEBREX   esomeprazole 40 MG capsule Commonly known as: [place] by: pantoprazole 40 MG tablet     TAKE these medications   aspirin EC 81 MG tablet Take 1 tablet (81 mg total) by mouth daily.   clopidogrel 75 MG tablet Commonly known as: PLAVIX Take 1 tablet (75 mg total) by mouth daily with breakfast. Start taking on: December 05, 2019   escitalopram 10 MG tablet Commonly known as: LEXAPRO Take 10 mg by mouth daily.   ferrous sulfate 325 (65 FE) MG tablet Take 325 mg by mouth daily with breakfast.   metoprolol succinate 25 MG 24 hr tablet Commonly known as: TOPROL-XL Take 25 mg by mouth daily.   multivitamin with minerals Tabs tablet Take 1 tablet by mouth daily. VitaFusion Women's  Multivitamin   OSTEO BI-FLEX JOINT SHIELD PO Take 1 tablet by mouth daily.   pantoprazole 40 MG tablet Commonly known as: PROTONIX Take 1 tablet (40 mg total) by mouth daily. Start taking on: December 05, 2019 Replaces: esomeprazole 40 MG capsule   Travoprost (BAK Free) 0.004 % Soln ophthalmic solution Commonly known as: TRAVATAN Place 1 drop into both eyes at bedtime.   Vitamin D-3 125 MCG (5000 UT) Tabs Take 5,000 Units by mouth daily.  Outstanding Labs/Studies   none  Duration of Discharge Encounter   Greater than 30 minutes including physician time.  SignedAngelena Form, PA-C 12/04/2019, 10:54 AM [phone]

## 2019-12-04 NOTE — Progress Notes (Signed)
  Echocardiogram 2D Echocardiogram has been performed.  Arien Morine G Lakayla Barrington 12/04/2019, 9:50 AM

## 2019-12-04 NOTE — Plan of Care (Signed)

## 2019-12-04 NOTE — Progress Notes (Signed)
Discharge instructions given to Mrs. Mcguigan and her daughter.  Discussed new medications, medication changes and side effects to watch for.  Discussed signs and symptoms to watch for and when to contact the physician.  Discussed activities and restrictions.  Discussed follow up appointments.  Verbalized understanding.

## 2019-12-04 NOTE — Progress Notes (Signed)
1 Day Post-Op Procedure(s) (LRB): TRANSCATHETER AORTIC VALVE REPLACEMENT, TRANSFEMORAL (N/A) TRANSESOPHAGEAL ECHOCARDIOGRAM (TEE) (N/A) Subjective: No complaints. Ambulated last night but not this am yet. Breathing is fine.  Objective: Vital signs in last 24 hours: Temp:  [97 F (36.1 C)-99.4 F (37.4 C)] 98.7 F (37.1 C) (04/07 0806) Pulse Rate:  [0-190] 85 (04/07 0806) Cardiac Rhythm: Normal sinus rhythm (04/07 0700) Resp:  [11-27] 19 (04/07 0806) BP: (79-153)/(31-78) 134/60 (04/07 0806) SpO2:  [90 %-96 %] 94 % (04/07 0806) Weight:  [93 kg-93.4 kg] 93.4 kg (04/07 0452)  Hemodynamic parameters for last 24 hours:    Intake/Output from previous day: 04/06 0701 - 04/07 0700 In: 1440 [P.O.:240; I.V.:800; IV Piggyback:400] Out: 25 [Blood:25] Intake/Output this shift: No intake/output data recorded.  General appearance: alert and cooperative Neurologic: intact Heart: regular rate and rhythm, S1, S2 normal, no murmur Lungs: clear to auscultation bilaterally Extremities: extremities normal, no edema Wound: groin sites ok  Lab Results: Recent Labs    12/03/19 1123 12/04/19 0301  WBC  --  11.8*  HGB 10.2* 10.8*  HCT 30.0* 32.9*  PLT  --  234   BMET:  Recent Labs    12/03/19 1123 12/04/19 0301  NA 137 135  K 4.1 3.5  CL 100 100  CO2  --  24  GLUCOSE 146* 118*  BUN 11 11  CREATININE 0.50 0.67  CALCIUM  --  8.3*    PT/INR: No results for input(s): LABPROT, INR in the last 72 hours. ABG    Component Value Date/Time   PHART 7.447 12/03/2019 0830   HCO3 26.5 12/03/2019 0830   TCO2 30 12/03/2019 1123   O2SAT 94.4 12/03/2019 0830   CBG (last 3)  No results for input(s): GLUCAP in the last 72 hours.  ECG: sinus, no heart block  Assessment/Plan: S/P Procedure(s) (LRB): TRANSCATHETER AORTIC VALVE REPLACEMENT, TRANSFEMORAL (N/A) TRANSESOPHAGEAL ECHOCARDIOGRAM (TEE) (N/A)  POD 1 Hemodynamically stable in sinus rhythm. Plan 2D echo this am. ASA and Plavix  for THV. Ambulate this am and plan home with daughter later this morning.   LOS: 1 day    Gaye Pollack 12/04/2019

## 2019-12-04 NOTE — Progress Notes (Signed)
Pt refused to ambulate in the hallway this morning. However, she was able to ambulate in the room independently throughout the night. Tolerated well. Denies any pain. Wishes to rest at this time.

## 2019-12-04 NOTE — Progress Notes (Signed)
CARDIAC REHAB PHASE I   PRE:  Rate/Rhythm: 97 SR  BP:  Sitting: 134/60      SaO2: 92 RA  MODE:  Ambulation: 130 ft   POST:  Rate/Rhythm: 103 ST  BP:  Sitting: 140/61    SaO2: 95 RA  Pt helped to restroom than ambulated 129ft in hallway assist of one with front wheel walker. Pt states she thinks her breathing is improved, but ambulation limited to knee pain. Reviewed restrictions, site care, and exercise guidelines. Will refer to CRP II [place]. Pt is interested in participating in Virtual Cardiac and Pulmonary Rehab. Pt advised that Virtual Cardiac and Pulmonary Rehab is provided at no cost to the patient.  Checklist:  1. Pt has smart device  ie smartphone and/or ipad for downloading an app  Yes 2. Reliable internet/wifi service    Yes 3. Understands how to use their smartphone and navigate within an app.  Yes  Pt verbalized understanding and is in agreement.  [id number] Rufina Falco, RN BSN 12/04/2019 10:45 AM

## 2019-12-05 ENCOUNTER — Telehealth: Payer: Self-pay | Admitting: Physician Assistant

## 2019-12-05 ENCOUNTER — Telehealth (HOSPITAL_COMMUNITY): Payer: Self-pay

## 2019-12-05 LAB — ECHOCARDIOGRAM COMPLETE
Height: 66 in
Weight: 3296 oz

## 2019-12-05 NOTE — Telephone Encounter (Signed)
Pt insurance is active and benefits verified through [employer]. Co-pay $20.00, DED $0.00/$0.00 met, out of pocket $3,730.00/$630.00 met, co-insurance 0%. No pre-authorization required. Passport, 12/05/19 @ 4:11PM, REF#[id number]  Will contact patient to see if she is interested in the Cardiac Rehab Program. If interested, patient will need to complete follow up appt. Once completed, patient will be contacted for scheduling upon review by the RN Navigator. 

## 2019-12-05 NOTE — Telephone Encounter (Signed)
  [place] VALVE TEAM   Patient contacted regarding discharge from [facility] on 4/7  Patient understands to follow up with provider Nell Range on 4/14 at [place].  Patient understands discharge instructions? yes Patient understands medications and regimen? yes Patient understands to bring all medications to this visit? yes  Angelena Form PA-C  [facility]

## 2019-12-05 NOTE — Telephone Encounter (Signed)
Called patient to see if she is interested in the Cardiac Rehab Program. Patient stated not at this time. ° °Closed referral °

## 2019-12-09 NOTE — Progress Notes (Signed)
[facility]                                       Cardiology Office Note    Date:  12/11/2019   ID:  Mackenzie Jensen, DOB 07-03-1934, MRN [id number]  PCP:  Moshe Cipro, MD  Cardiologist:  Dr Rayann Heman / Dr. Burt Knack & Dr. Cyndia Bent (TAVR)  CC: [facility] s/p TAVR  History of Present Illness:  Mackenzie Jensen is a 86 y.o. female with a history of large hiatal hernia, GERD, anxiety, insomnia, obesity, and severe aortic stenosis s/p TAVR (12/03/19) who presents to clinic for follow  Up.   She has been followed for aortic stenosis since at least 10/2016. Echocardiogram in March 2018 showed mildly calcified aortic valve leaflets with a mean gradient of 13 mmHg. Subsequent echoes have shown a progressive increase in the mean gradient across her aortic valve.It was noted to be 37 mmHg in June 2020 and was 34mmHg on her most recent echo dated 10/14/2019. Aortic valve area by VTI is measured at 0.62 cm.Left ventricular ejection fraction is 60 to 65% with moderate LVH and grade 1 diastolic dysfunction. She has reported progressive dyspnea on exertion and fatigue over the past 6 months. Diagnostic cath on 10/29/19 showed mild non obst CAD, calcific aortic stenosis, moderately severe with peak to peak gradient 36 mmHg and mean gradient 28 mmHg, AVA 1.09 cm and LVEDP 16.   She was evaluated by the multidisciplinary valve team and underwent successful TAVR with a 26 mm Edwards Sapien 3 THV via the TF approach on 12/03/19. Post operative echo showed EF 65-70%, normally functioning TAVR with a mean gradient of 12 mm Hg and no PVL. She was discharged home on aspirin and plavix. Celebrex was discontinued.   Today she presents to clinic for follow up. She is here with her daughter today. Very disappointed with her progress. Feels more depressed because her breathing has not improved like she hoped. Daughter said she is being very sedentary, which is not helping. Also gaining  weight. No chest pain, LE edema, orthopnea or PND. Wonders if hiatal hernia contributing.    Past Medical History:  Diagnosis Date  . Anemia   . Anxiety   . GERD (gastroesophageal reflux disease)   . Hiatal hernia   . Hypertension   . Insomnia   . Osteopenia   . Right knee DJD   . 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 Burt Knack and Dr. Cyndia Bent  . Severe aortic stenosis   . Skin cancer   . Vulvar cancer [facility])     Past Surgical History:  Procedure Laterality Date  . COSMETIC SURGERY    . RIGHT/LEFT HEART CATH AND CORONARY ANGIOGRAPHY N/A 10/29/2019   Procedure: RIGHT/LEFT HEART CATH AND CORONARY ANGIOGRAPHY;  Surgeon: Belva Crome, MD;  Location: [place] CV LAB;  Service: Cardiovascular;  Laterality: N/A;  . TEE WITHOUT CARDIOVERSION N/A 12/03/2019   Procedure: TRANSESOPHAGEAL ECHOCARDIOGRAM (TEE);  Surgeon: Sherren Mocha, MD;  Location: [place] CV LAB;  Service: Open Heart Surgery;  Laterality: N/A;  . TONSILLECTOMY AND ADENOIDECTOMY    . TRANSCATHETER AORTIC VALVE REPLACEMENT, TRANSFEMORAL N/A 12/03/2019   Procedure: TRANSCATHETER AORTIC VALVE REPLACEMENT, TRANSFEMORAL;  Surgeon: Sherren Mocha, MD;  Location: [place] CV LAB;  Service: Open Heart Surgery;  Laterality: N/A;  . TUBAL LIGATION    . VULVA SURGERY      Current Medications: Outpatient Medications Prior to Visit  Medication Sig Dispense Refill  . aspirin EC 81 MG tablet Take 1 tablet (81 mg total) by mouth daily.    . Cholecalciferol (VITAMIN D-3) 125 MCG (5000 UT) TABS Take 5,000 Units by mouth daily.    . clopidogrel (PLAVIX) 75 MG tablet Take 1 tablet (75 mg total) by mouth daily with breakfast. 90 tablet 1  . escitalopram (LEXAPRO) 10 MG tablet Take 10 mg by mouth daily.     . ferrous sulfate 325 (65 FE) MG tablet Take 325 mg by mouth daily with breakfast.    . metoprolol succinate (TOPROL-XL) 25 MG 24 hr tablet Take 25  mg by mouth daily.    . Misc Natural Products (OSTEO BI-FLEX JOINT SHIELD PO) Take 1 tablet by mouth daily.    . Multiple Vitamin (MULTIVITAMIN WITH MINERALS) TABS tablet Take 1 tablet by mouth daily. VitaFusion Women's Multivitamin    . pantoprazole (PROTONIX) 40 MG tablet Take 1 tablet (40 mg total) by mouth daily. 90 tablet 1  . Travoprost, BAK Free, (TRAVATAN) 0.004 % SOLN ophthalmic solution Place 1 drop into both eyes at bedtime.      No facility-administered medications prior to visit.     Allergies:   Dorzolamide hcl-timolol mal and Novocain [procaine]   Social History   Socioeconomic History  . Marital status: Married    Spouse name: Not on file  . Number of children: 2  . Years of education: Not on file  . Highest education level: Not on file  Occupational History  . Not on file  Tobacco Use  . Smoking status: Former Smoker    Packs/day: 1.00    Years: 30.00    Pack years: 30.00    Types: Cigarettes  . Smokeless tobacco: Never Used  . Tobacco comment: Quit 30 years ago  Substance and Sexual Activity  . Alcohol use: Not Currently  . Drug use: No  . Sexual activity: Not on file  Other Topics Concern  . Not on file  Social History Narrative   Lives with husband.    Social Determinants of Health   Financial Resource Strain:   . Difficulty of Paying Living Expenses:   Food Insecurity:   . Worried About Charity fundraiser in the Last Year:   . Arboriculturist in the Last Year:   Transportation Needs:   . Film/video editor (Medical):   Mackenzie Jensen Lack of Transportation (Non-Medical):   Physical Activity:   . Days of Exercise per Week:   . Minutes of Exercise per Session:   Stress:   . Feeling of Stress :   Social Connections:   . Frequency of Communication with Friends and Family:   . Frequency of Social Gatherings with Friends and Family:   . Attends Religious Services:   . Active Member of Clubs or Organizations:   . Attends Archivist Meetings:     Mackenzie Jensen Marital Status:      Family History:  The patient's family history includes CAD (age of onset: 67) in her brother; CAD (age of onset: 64) in her father.     ROS:   Please see the history of present illness.    ROS All other systems reviewed and are negative.   PHYSICAL EXAM:   VS:  BP (!) 108/58   Pulse  69   Ht 5\' 6"  (1.676 m)   Wt 204 lb 12.8 oz (92.9 kg)   SpO2 93%   BMI 33.06 kg/m    GEN: Well nourished, well developed, in no acute distress HEENT: normal Neck: no JVD or masses Cardiac: RRR; soft flow murmur. No rubs, or gallops,no edema  Respiratory:  clear to auscultation bilaterally, normal work of breathing GI: soft, nontender, nondistended, + BS MS: no deformity or atrophy Skin: warm and dry, no rash.  Groin sites clear without hematoma or ecchymosis  Neuro:  Alert and Oriented x 3, Strength and sensation are intact Psych: euthymic mood, full affect   Wt Readings from Last 3 Encounters:  12/11/19 204 lb 12.8 oz (92.9 kg)  12/04/19 206 lb (93.4 kg)  11/29/19 204 lb 8 oz (92.8 kg)      Studies/Labs Reviewed:   EKG:  EKG is ordered today.  The ekg ordered today demonstrates sinus HR 69 bpm  Recent Labs: 11/29/2019: ALT 14; B Natriuretic Peptide 184.6 12/04/2019: BUN 11; Creatinine, Ser 0.67; Hemoglobin 10.8; Magnesium 1.8; Platelets 234; Potassium 3.5; Sodium 135   Lipid Panel    Component Value Date/Time   CHOL 221 (A) 06/01/2016 0000   TRIG 123 06/01/2016 0000   HDL 80 (A) 06/01/2016 0000   CHOLHDL 3 12/29/2009 0923   VLDL 27.2 12/29/2009 0923   LDLCALC 116 06/01/2016 0000   LDLDIRECT 133.4 12/29/2009 0923    Additional studies/ records that were reviewed today include:  TAVR OPERATIVE NOTE   Date of Procedure:12/03/2019  Preoperative Diagnosis:Severe Aortic Stenosis   Postoperative Diagnosis:Same   Procedure:   Transcatheter Aortic Valve Replacement - PercutaneousRightTransfemoral  Approach Edwards Sapien 3 Ultra THV (size 51mm, model # 9750TFX, serial # [id number])  Co-Surgeons:Bryan Alveria Apley, MD and Sherren Mocha, MD   Anesthesiologist:K. Smith Robert, MD  Echocardiographer:M. Croituru, MD  Pre-operative Echo Findings: ? Severe aortic stenosis ? Normalleft ventricular systolic function  Post-operative Echo Findings: ? Noparavalvular leak ? Normalleft ventricular systolic function  _____________   Echo 12/04/19: IMPRESSIONS  1. Left ventricular ejection fraction, by estimation, is 65 to 70%. The  left ventricle has normal function. The left ventricle has no regional  wall motion abnormalities. There is mild concentric left ventricular  hypertrophy. Left ventricular diastolic  parameters are consistent with Grade II diastolic dysfunction  (pseudonormalization). Elevated left atrial pressure.  2. Right ventricular systolic function is normal. The right ventricular  size is normal. There is normal pulmonary artery systolic pressure. The  estimated right ventricular systolic pressure is [id number] mmHg.  3. Left atrial size was moderately dilated.  4. The mitral valve is normal in structure. No evidence of mitral valve  regurgitation. No evidence of mitral stenosis.  5. The aortic valve has been repaired/replaced. Aortic valve  regurgitation is not visualized. No aortic stenosis is present. There is a  26 mm Edwards Sapien prosthetic (TAVR) valve present in the aortic  position. Procedure Date: 12/03/2019. Echo  findings are consistent with normal structure and function of the aortic  valve prosthesis. Aortic valve mean gradient measures 12.0 mmHg. Aortic  valve Vmax measures 2.36 m/s.  6. The inferior vena cava is dilated in size with >50% respiratory  variability, suggesting right atrial pressure of 8 mmHg.    ASSESSMENT & PLAN:   Severe AS s/p TAVR:doing okay. Still  very short of breath and feeling disappointed about this. Hiatal hernia could be contributing. Will get her into see Dr. Kipp Brood after her 1 month echo to see  if it would be amenabled to robotic repair. Groin sites healing well. ECG with no HAVB. Continue aspirin and plavix. SBE prophylaxis discussed; I have RX'd amoxicillin.    Chronic diastolic CHF: appears euvolemic. But with continued dyspnea will check lab work including BNP to assess for any occult CHF.   GERD: Nexium was changed to Protonix given potential drug drug interaction with Plavix.   Hiatal hernia: will get her into see Dr. Kipp Brood after her 1 mont visit to discuss robotic repair of her hiatal hernia. I think this is contributing to her ongoing dyspnea.    Medication Adjustments/Labs and Tests Ordered: Current medicines are reviewed at length with the patient today.  Concerns regarding medicines are outlined above.  Medication changes, Labs and Tests ordered today are listed in the Patient Instructions below. Patient Instructions  Medication Instructions:  Your provider recommends that you continue on your current medications as directed. Please refer to the Current Medication list given to you today.    Labwork: TODAY: BMET, BNP, CBC  Follow-Up: Please keep your follow-up appointments 4/29.    Signed, Angelena Form, PA-C  12/11/2019 3:55 PM    [facility] [place], [place], [place]  [address] Phone: [phone]; Fax: [phone]

## 2019-12-11 ENCOUNTER — Encounter: Payer: Self-pay | Admitting: Physician Assistant

## 2019-12-11 ENCOUNTER — Other Ambulatory Visit: Payer: Self-pay | Admitting: Physician Assistant

## 2019-12-11 ENCOUNTER — Other Ambulatory Visit: Payer: Self-pay

## 2019-12-11 ENCOUNTER — Ambulatory Visit: Payer: Medicare PPO | Admitting: Physician Assistant

## 2019-12-11 VITALS — BP 108/58 | HR 69 | Ht 66.0 in | Wt 204.8 lb

## 2019-12-11 DIAGNOSIS — Z952 Presence of prosthetic heart valve: Secondary | ICD-10-CM | POA: Diagnosis not present

## 2019-12-11 DIAGNOSIS — K449 Diaphragmatic hernia without obstruction or gangrene: Secondary | ICD-10-CM

## 2019-12-11 DIAGNOSIS — K219 Gastro-esophageal reflux disease without esophagitis: Secondary | ICD-10-CM

## 2019-12-11 DIAGNOSIS — I5032 Chronic diastolic (congestive) heart failure: Secondary | ICD-10-CM | POA: Diagnosis not present

## 2019-12-11 MED ORDER — AMOXICILLIN 500 MG PO TABS: 2000.0000 mg | ORAL_TABLET | ORAL | 12 refills | Status: AC

## 2019-12-11 NOTE — Patient Instructions (Addendum)
Medication Instructions:  Your provider recommends that you continue on your current medications as directed. Please refer to the Current Medication list given to you today.    Labwork: TODAY: BMET, BNP, CBC  Follow-Up: Please keep your follow-up appointments 4/29.

## 2019-12-12 ENCOUNTER — Ambulatory Visit: Payer: Medicare PPO | Admitting: Physician Assistant

## 2019-12-12 LAB — CBC WITH DIFFERENTIAL/PLATELET
Basophils Absolute: 0.1 10*3/uL (ref 0.0–0.2)
Basos: 1 %
EOS (ABSOLUTE): 0.1 10*3/uL (ref 0.0–0.4)
Eos: 1 %
Hematocrit: 34.9 % (ref 34.0–46.6)
Hemoglobin: 12.1 g/dL (ref 11.1–15.9)
Immature Grans (Abs): 0 10*3/uL (ref 0.0–0.1)
Immature Granulocytes: 0 %
Lymphocytes Absolute: 1.2 10*3/uL (ref 0.7–3.1)
Lymphs: 10 %
MCH: 30.9 pg (ref 26.6–33.0)
MCHC: 34.7 g/dL (ref 31.5–35.7)
MCV: 89 fL (ref 79–97)
Monocytes Absolute: 0.8 10*3/uL (ref 0.1–0.9)
Monocytes: 7 %
Neutrophils Absolute: 9.4 10*3/uL — ABNORMAL HIGH (ref 1.4–7.0)
Neutrophils: 81 %
Platelets: 377 10*3/uL (ref 150–450)
RBC: 3.92 x10E6/uL (ref 3.77–5.28)
RDW: 13.1 % (ref 11.7–15.4)
WBC: 11.7 10*3/uL — ABNORMAL HIGH (ref 3.4–10.8)

## 2019-12-12 LAB — BASIC METABOLIC PANEL
BUN/Creatinine Ratio: 18 (ref 12–28)
BUN: 14 mg/dL (ref 8–27)
CO2: 26 mmol/L (ref 20–29)
Calcium: 9.1 mg/dL (ref 8.7–10.3)
Chloride: 96 mmol/L (ref 96–106)
Creatinine, Ser: 0.77 mg/dL (ref 0.57–1.00)
GFR calc Af Amer: 83 mL/min/{1.73_m2} (ref >59)
GFR calc non Af Amer: 72 mL/min/{1.73_m2} (ref >59)
Glucose: 114 mg/dL — ABNORMAL HIGH (ref 65–99)
Potassium: 4.3 mmol/L (ref 3.5–5.2)
Sodium: 134 mmol/L (ref 134–144)

## 2019-12-12 LAB — PRO B NATRIURETIC PEPTIDE: NT-Pro BNP: 715 pg/mL (ref 0–738)

## 2019-12-19 ENCOUNTER — Telehealth: Payer: Self-pay | Admitting: Physician Assistant

## 2019-12-19 NOTE — Telephone Encounter (Signed)
   Went to chart to check notes from [place]. Advise pt dental office needs to call or send clearance for her upcoming procedure

## 2019-12-20 ENCOUNTER — Telehealth: Payer: Self-pay | Admitting: *Deleted

## 2019-12-20 NOTE — Telephone Encounter (Signed)
Follow up  Pt called, she said, she would like to receive updates from her clearance request.

## 2019-12-20 NOTE — Telephone Encounter (Signed)
   Primary Cardiologist: Minus Breeding, MD  Chart reviewed as part of pre-operative protocol coverage. Simple dental extractions are considered low risk procedures per guidelines and generally do not require any specific cardiac clearance. It is also generally accepted that for simple extractions and dental cleanings, there is no need to interrupt blood thinner therapy.   SBE prophylaxis is required for the patient.  I will route this recommendation to the requesting party via Epic fax function and remove from pre-op pool.  Please call with questions.  Deberah Pelton, NP 12/20/2019, 3:06 PM

## 2019-12-20 NOTE — Telephone Encounter (Signed)
   [facility] Pre-operative Risk Assessment    Request for surgical clearance:  1. What type of surgery is being performed? Radiographs, fillings, crowns, bridges, extraction, root canal   2. When is this surgery scheduled? TBD  3. What type of clearance is required (medical clearance vs. Pharmacy clearance to hold med vs. Both)? both  4. Are there any medications that need to be held prior to surgery and how long? plavix-need direction  5. Practice name and name of physician performing surgery? [facility]  6. What is your office phone number [phone]   7.   What is your office fax number  [phone]  8.   Anesthesia type (None, local, MAC, general) ? Local with epinephrine and IV sedation with fentanyl and versed  According to the clearance sent=Ms Linna Darner has not been seen in our office in almost a year. We are not exactly sure what procedures will need to be completed at this time. She is scheduled next week for an exam and x-rays. She would like to be sedated for dental treatment once diagnosed.   Fredia Beets 12/20/2019, 2:10 PM  _________________________________________________________________   (provider comments below)

## 2019-12-24 NOTE — Progress Notes (Signed)
[facility] [place]                                       Cardiology Office Note    Date:  12/26/2019   ID:  Mackenzie Jensen, DOB 1934-02-01, MRN [id number]  PCP:  Moshe Cipro, MD  Cardiologist:  Dr Rayann Heman / Dr. Burt Knack & Dr. Cyndia Bent (TAVR)  CC: 1 month s/p TAVR  History of Present Illness:  Mackenzie Jensen is a 86 y.o. female with a history of large hiatal hernia, GERD, anxiety, insomnia, obesity, and severe aortic stenosis s/p TAVR (12/03/19) who presents to clinic for follow up.   She has been followed for aortic stenosis since at least 10/2016. Echocardiogram in March 2018 showed mildly calcified aortic valve leaflets with a mean gradient of 13 mmHg. Subsequent echoes have shown a progressive increase in the mean gradient across her aortic valve.It was noted to be 37 mmHg in June 2020 and was 103mmHg on her most recent echo dated 10/14/2019. Aortic valve area by VTI is measured at 0.62 cm.Left ventricular ejection fraction is 60 to 65% with moderate LVH and grade 1 diastolic dysfunction. She has reported progressive dyspnea on exertion and fatigue over the past 6 months. Diagnostic cath on 10/29/19 showed mild non obst CAD, calcific aortic stenosis, moderately severe with peak to peak gradient 36 mmHg and mean gradient 28 mmHg, AVA 1.09 cm and LVEDP 16.   She was evaluated by the multidisciplinary valve team and underwent successful TAVR with a 26 mm Edwards Sapien 3 THV via the TF approach on 12/03/19. Post operative echo showed EF 65-70%, normally functioning TAVR with a mean gradient of 12 mm Hg and no PVL. She was discharged home on aspirin and plavix. Celebrex was discontinued.   She has had continued dyspnea since TAVR. Follow up labs including BNP were normal. Dyspnea felt to be potentially related to her extremely large hiatal hernia.   Today she presents to clinic for follow up. Her with her daughter .Still very short of breath with minimal  activity. Very frustrated by this. She was seen by her dentist and told she needed a dental extraction. She was also told her sinuses were inflamed. She was worried this would hurt her heart valve. No CP. No LE edema, orthopnea or PND. No dizziness or syncope. No blood in stool or urine. No palpitations.    Past Medical History:  Diagnosis Date  . Anemia   . Anxiety   . GERD (gastroesophageal reflux disease)   . Hiatal hernia   . Hypertension   . Insomnia   . Osteopenia   . Right knee DJD   . 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 Burt Knack and Dr. Cyndia Bent  . Severe aortic stenosis   . Skin cancer   . Vulvar cancer [facility])     Past Surgical History:  Procedure Laterality Date  . COSMETIC SURGERY    . RIGHT/LEFT HEART CATH AND CORONARY ANGIOGRAPHY N/A 10/29/2019   Procedure: RIGHT/LEFT HEART CATH AND CORONARY ANGIOGRAPHY;  Surgeon: Belva Crome, MD;  Location: [place] [facility];  Service: Cardiovascular;  Laterality: N/A;  . TEE WITHOUT CARDIOVERSION N/A 12/03/2019   Procedure: TRANSESOPHAGEAL ECHOCARDIOGRAM (TEE);  Surgeon: Sherren Mocha, MD;  Location: [place] [facility];  Service: Open Heart Surgery;  Laterality: N/A;  . TONSILLECTOMY AND ADENOIDECTOMY    . TRANSCATHETER AORTIC VALVE REPLACEMENT, TRANSFEMORAL N/A 12/03/2019   Procedure: TRANSCATHETER AORTIC VALVE REPLACEMENT, TRANSFEMORAL;  Surgeon: Sherren Mocha, MD;  Location: [place] [facility];  Service: Open Heart Surgery;  Laterality: N/A;  . TUBAL LIGATION    . VULVA SURGERY      Current Medications: Outpatient Medications Prior to Visit  Medication Sig Dispense Refill  . amoxicillin (AMOXIL) 500 MG tablet Take 4 tablets (2,000 mg total) by mouth as directed. 1 hour prior to dental work including cleanings 12 tablet 12  . aspirin EC 81 MG tablet Take 1 tablet (81 mg total) by mouth daily.    . Cholecalciferol (VITAMIN D-3) 125 MCG (5000 UT)  TABS Take 5,000 Units by mouth daily.    . clopidogrel (PLAVIX) 75 MG tablet Take 1 tablet (75 mg total) by mouth daily with breakfast. 90 tablet 1  . escitalopram (LEXAPRO) 10 MG tablet Take 10 mg by mouth daily.     . ferrous sulfate 325 (65 FE) MG tablet Take 325 mg by mouth daily with breakfast.    . metoprolol succinate (TOPROL-XL) 25 MG 24 hr tablet Take 25 mg by mouth daily.    . Misc Natural Products (OSTEO BI-FLEX JOINT SHIELD PO) Take 1 tablet by mouth daily.    . Multiple Vitamin (MULTIVITAMIN WITH MINERALS) TABS tablet Take 1 tablet by mouth daily. VitaFusion Women's Multivitamin    . pantoprazole (PROTONIX) 40 MG tablet Take 1 tablet (40 mg total) by mouth daily. 90 tablet 1  . Travoprost, BAK Free, (TRAVATAN) 0.004 % SOLN ophthalmic solution Place 1 drop into both eyes at bedtime.      No facility-administered medications prior to visit.     Allergies:   Dorzolamide hcl-timolol mal and Novocain [procaine]   Social History   Socioeconomic History  . Marital status: Married    Spouse name: Not on file  . Number of children: 2  . Years of education: Not on file  . Highest education level: Not on file  Occupational History  . Not on file  Tobacco Use  . Smoking status: Former Smoker    Packs/day: 1.00    Years: 30.00    Pack years: 30.00    Types: Cigarettes  . Smokeless tobacco: Never Used  . Tobacco comment: Quit 30 years ago  Substance and Sexual Activity  . Alcohol use: Not Currently  . Drug use: No  . Sexual activity: Not on file  Other Topics Concern  . Not on file  Social History Narrative   Lives with husband.    Social Determinants of Health   Financial Resource Strain:   . Difficulty of Paying Living Expenses:   Food Insecurity:   . Worried About Charity fundraiser in the Last Year:   . Arboriculturist in the Last Year:   Transportation Needs:   . Film/video editor (Medical):   Marland Kitchen Lack of Transportation (Non-Medical):   Physical Activity:    . Days of Exercise per Week:   . Minutes of Exercise per Session:   Stress:   . Feeling of Stress :   Social Connections:   . Frequency of Communication with Friends and Family:   . Frequency of Social Gatherings with Friends and Family:   . Attends Religious Services:   . Active Member of Clubs or Organizations:   . Attends Archivist Meetings:   .  Marital Status:      Family History:  The patient's family history includes CAD (age of onset: 80) in her brother; CAD (age of onset: 44) in her father.     ROS:   Please see the history of present illness.    ROS All other systems reviewed and are negative.   PHYSICAL EXAM:   VS:  BP 114/60   Pulse 70   Ht 5\' 6"  (1.676 m)   SpO2 97%   BMI 33.06 kg/m    GEN: Well nourished, well developed, in no acute distress HEENT: normal Neck: no JVD or masses Cardiac: RRR; soft flow murmur. No rubs, or gallops,no edema  Respiratory:  clear to auscultation bilaterally, normal work of breathing GI: soft, nontender, nondistended, + BS MS: no deformity or atrophy Skin: warm and dry, no rash.  Neuro:  Alert and Oriented x 3, Strength and sensation are intact Psych: euthymic mood, full affect   Wt Readings from Last 3 Encounters:  12/11/19 204 lb 12.8 oz (92.9 kg)  12/04/19 206 lb (93.4 kg)  11/29/19 204 lb 8 oz (92.8 kg)      Studies/Labs Reviewed:   EKG:  EKG is NOT ordered today.    Recent Labs: 11/29/2019: ALT 14; B Natriuretic Peptide 184.6 12/04/2019: Magnesium 1.8 12/11/2019: BUN 14; Creatinine, Ser 0.77; Hemoglobin 12.1; NT-Pro BNP 715; Platelets 377; Potassium 4.3; Sodium 134   Lipid Panel    Component Value Date/Time   CHOL 221 (A) 06/01/2016 0000   TRIG 123 06/01/2016 0000   HDL 80 (A) 06/01/2016 0000   CHOLHDL 3 12/29/2009 0923   VLDL 27.2 12/29/2009 0923   LDLCALC 116 06/01/2016 0000   LDLDIRECT 133.4 12/29/2009 0923    Additional studies/ records that were reviewed today include:  TAVR OPERATIVE  NOTE   Date of Procedure:12/03/2019  Preoperative Diagnosis:Severe Aortic Stenosis   Postoperative Diagnosis:Same   Procedure:   Transcatheter Aortic Valve Replacement - PercutaneousRightTransfemoral Approach Edwards Sapien 3 Ultra THV (size 68mm, model # 9750TFX, serial # [id number])  Co-Surgeons:Bryan Alveria Apley, MD and Sherren Mocha, MD   Anesthesiologist:K. Smith Robert, MD  Echocardiographer:M. Croituru, MD  Pre-operative Echo Findings: ? Severe aortic stenosis ? Normalleft ventricular systolic function  Post-operative Echo Findings: ? Noparavalvular leak ? Normalleft ventricular systolic function  _____________   Echo 12/04/19: IMPRESSIONS  1. Left ventricular ejection fraction, by estimation, is 65 to 70%. The  left ventricle has normal function. The left ventricle has no regional  wall motion abnormalities. There is mild concentric left ventricular  hypertrophy. Left ventricular diastolic  parameters are consistent with Grade II diastolic dysfunction  (pseudonormalization). Elevated left atrial pressure.  2. Right ventricular systolic function is normal. The right ventricular  size is normal. There is normal pulmonary artery systolic pressure. The  estimated right ventricular systolic pressure is [id number] mmHg.  3. Left atrial size was moderately dilated.  4. The mitral valve is normal in structure. No evidence of mitral valve  regurgitation. No evidence of mitral stenosis.  5. The aortic valve has been repaired/replaced. Aortic valve  regurgitation is not visualized. No aortic stenosis is present. There is a  26 mm Edwards Sapien prosthetic (TAVR) valve present in the aortic  position. Procedure Date: 12/03/2019. Echo  findings are consistent with normal structure and function of the aortic  valve prosthesis. Aortic valve mean gradient  measures 12.0 mmHg. Aortic  valve Vmax measures 2.36 m/s.  6. The inferior vena cava is dilated in size with >50% respiratory  variability,  suggesting right atrial pressure of 8 mmHg.   _____________   Echo 12/25/19: IMPRESSIONS   1. Left ventricular ejection fraction, by estimation, is 60 to 65%. The left ventricle has normal function. The left ventricle has no regional wall motion abnormalities. There is mild left ventricular hypertrophy. Left ventricular diastolic parameters  are consistent with Grade II diastolic dysfunction (pseudonormalization). Elevated left ventricular end-diastolic pressure. The average left ventricular global longitudinal strain is -21.7 %. The global longitudinal strain is normal.  2. Right ventricular systolic function is normal. The right ventricular size is normal. There is normal pulmonary artery systolic pressure. The estimated right ventricular systolic pressure is 0000000 mmHg.  3. Left atrial size was mild to moderately dilated.  4. The mitral valve is abnormal. Trivial mitral valve regurgitation.  5. The aortic valve has been repaired/replaced. Aortic valve regurgitation is not visualized. There is a 26 mm Edwards Edwards Sapien prosthetic (TAVR) valve present in the aortic position. Procedure Date: 12/04/19. Echo findings are consistent with  normal structure and function of the aortic valve prosthesis. Aortic valve area, by VTI measures 1.77 cm. Aortic valve mean gradient measures 11.0 mmHg. Aortic valve Vmax measures 2.12 m/s.  6. The inferior vena cava is dilated in size with >50% respiratory variability, suggesting right atrial pressure of 8 mmHg.  ASSESSMENT & PLAN:   Severe AS s/p TAVR: Echo today shows EF 60%, normally functioning TAVR with a mean gradient of 63mmHg and no PVL. She has NYHA class III symptoms with continue dyspnea with minimal exertion. SBE prophylaxis discussed; she has amoxicillin. I have stressed the importance of having the necessary  dental extractions completed as determined by her dentist. ASA can be discontinued after 6 months of therapy (05/2020).   Chronic diastolic CHF: she appears euvolemic off diuretics. Recent BNP normal.   GERD: Nexium was changed to Protonix given potential drug drug interaction with Plavix.   Hiatal hernia: will get her into see Dr. Kipp Brood visit to discuss robotic repair of her hiatal hernia. I think this is contributing to her ongoing dyspnea.   Medication Adjustments/Labs and Tests Ordered: Current medicines are reviewed at length with the patient today.  Concerns regarding medicines are outlined above.  Medication changes, Labs and Tests ordered today are listed in the Patient Instructions below. Patient Instructions  Medication Instructions:  1) STOP PLAVIX June 03, 2020 (6 months out from your TAVR) *If you need a refill on your cardiac medications before your next appointment, please call your pharmacy*  Follow-Up: At [facility], you and your health needs are our priority.  As part of our continuing mission to provide you with exceptional heart care, we have created designated Provider Care Teams.  These Care Teams include your primary Cardiologist (physician) and Advanced Practice Providers (APPs -  Physician Assistants and Nurse Practitioners) who all work together to provide you with the care you need, when you need it. Your next appointment:   6 month(s) Provider:   Dr. Vallery Ridge will be called to arrange an appointment with Dr. Kipp Brood.   You will be called to arrange your 1 year TAVR echo and office visit.    Signed, Angelena Form, PA-C  12/26/2019 3:49 PM    [facility] [address], [place], [place]  [address] Phone: [phone]; Fax: [phone]

## 2019-12-26 ENCOUNTER — Ambulatory Visit (HOSPITAL_COMMUNITY): Payer: Medicare PPO | Attending: Physician Assistant

## 2019-12-26 ENCOUNTER — Encounter: Payer: Self-pay | Admitting: Physician Assistant

## 2019-12-26 ENCOUNTER — Ambulatory Visit (INDEPENDENT_AMBULATORY_CARE_PROVIDER_SITE_OTHER): Payer: Medicare PPO | Admitting: Physician Assistant

## 2019-12-26 ENCOUNTER — Other Ambulatory Visit: Payer: Self-pay

## 2019-12-26 VITALS — BP 114/60 | HR 70 | Ht 66.0 in

## 2019-12-26 DIAGNOSIS — K219 Gastro-esophageal reflux disease without esophagitis: Secondary | ICD-10-CM | POA: Diagnosis not present

## 2019-12-26 DIAGNOSIS — Z952 Presence of prosthetic heart valve: Secondary | ICD-10-CM

## 2019-12-26 DIAGNOSIS — I5032 Chronic diastolic (congestive) heart failure: Secondary | ICD-10-CM | POA: Diagnosis not present

## 2019-12-26 DIAGNOSIS — K449 Diaphragmatic hernia without obstruction or gangrene: Secondary | ICD-10-CM | POA: Diagnosis not present

## 2019-12-26 NOTE — Patient Instructions (Signed)
Medication Instructions:  1) STOP PLAVIX June 03, 2020 (6 months out from your TAVR) *If you need a refill on your cardiac medications before your next appointment, please call your pharmacy*  Follow-Up: At [facility], you and your health needs are our priority.  As part of our continuing mission to provide you with exceptional heart care, we have created designated Provider Care Teams.  These Care Teams include your primary Cardiologist (physician) and Advanced Practice Providers (APPs -  Physician Assistants and Nurse Practitioners) who all work together to provide you with the care you need, when you need it. Your next appointment:   6 month(s) Provider:   Dr. Vallery Ridge will be called to arrange an appointment with Dr. Kipp Brood.   You will be called to arrange your 1 year TAVR echo and office visit.

## 2020-01-01 ENCOUNTER — Other Ambulatory Visit (HOSPITAL_COMMUNITY): Payer: Medicare PPO

## 2020-01-01 ENCOUNTER — Ambulatory Visit: Payer: Medicare PPO | Admitting: Physician Assistant

## 2020-01-03 ENCOUNTER — Other Ambulatory Visit: Payer: Self-pay

## 2020-01-03 ENCOUNTER — Encounter: Payer: Self-pay | Admitting: Thoracic Surgery (Cardiothoracic Vascular Surgery)

## 2020-01-03 ENCOUNTER — Institutional Professional Consult (permissible substitution): Payer: Medicare PPO | Admitting: Thoracic Surgery (Cardiothoracic Vascular Surgery)

## 2020-01-03 VITALS — BP 150/87 | HR 72 | Temp 97.9°F | Resp 20 | Ht 66.0 in | Wt 200.0 lb

## 2020-01-03 DIAGNOSIS — K449 Diaphragmatic hernia without obstruction or gangrene: Secondary | ICD-10-CM | POA: Diagnosis not present

## 2020-01-03 NOTE — Progress Notes (Signed)
[address]       [place] [address]             [phone]                    Mackenzie Jensen [place] Medical Record [id number] Date of Birth: 1933/12/25  Referring: Gaye Pollack, MD Primary Care: Moshe Cipro, MD Primary Cardiologist: Minus Breeding, MD  Chief Complaint:    Chief Complaint  Patient presents with  . Hernia    Surgical eval for large hiatal hernia repair    History of Present Illness:    Mackenzie Jensen 86 y.o. female status post transfemoral TAVR with a 26 mm Edwards sapient 3 12/03/2019.  The procedure was uncomplicated and follow-up echocardiogram showed good function gradient of 12 mmHg.  She does continue to have significant shortness of breath.  She has a known very large paraesophageal hernia.  She is quite symptomatic.  Her dyspnea is worse after meals, and she also complains of early satiety, and severe reflux.  She occasionally has odynophagia with meals.     Zubrod Score: At the time of surgery this patient's most appropriate activity status/level should be described as: [x]     0    Normal activity, no symptoms []     1    Restricted in physical strenuous activity but ambulatory, able to do out light work []     2    Ambulatory and capable of self care, unable to do work activities, up and about               >50 % of waking hours                              []     3    Only limited self care, in bed greater than 50% of waking hours []     4    Completely disabled, no self care, confined to bed or chair []     5    Moribund   Past Medical History:  Diagnosis Date  . Anemia   . Anxiety   . GERD (gastroesophageal reflux disease)   . Hiatal hernia   . Hypertension   . Insomnia   . Osteopenia   . Right knee DJD   . 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 Burt Knack and Dr. Cyndia Bent  . Severe aortic stenosis   . Skin cancer   . Vulvar cancer  [facility])     Past Surgical History:  Procedure Laterality Date  . COSMETIC SURGERY    . RIGHT/LEFT HEART CATH AND CORONARY ANGIOGRAPHY N/A 10/29/2019   Procedure: RIGHT/LEFT HEART CATH AND CORONARY ANGIOGRAPHY;  Surgeon: Belva Crome, MD;  Location: [place] [facility];  Service: Cardiovascular;  Laterality: N/A;  . TEE WITHOUT CARDIOVERSION N/A 12/03/2019   Procedure: TRANSESOPHAGEAL ECHOCARDIOGRAM (TEE);  Surgeon: Sherren Mocha, MD;  Location: [place] [facility];  Service: Open Heart Surgery;  Laterality: N/A;  . TONSILLECTOMY AND ADENOIDECTOMY    . TRANSCATHETER AORTIC VALVE REPLACEMENT, TRANSFEMORAL N/A 12/03/2019   Procedure: TRANSCATHETER AORTIC VALVE REPLACEMENT, TRANSFEMORAL;  Surgeon: Sherren Mocha, MD;  Location: [place] [facility];  Service: Open Heart Surgery;  Laterality: N/A;  . TUBAL LIGATION    . VULVA SURGERY  Family History  Problem Relation Age of Onset  . CAD Father 57  . CAD Brother 40     Social History   Tobacco Use  Smoking Status Former Smoker  . Packs/day: 1.00  . Years: 30.00  . Pack years: 30.00  . Types: Cigarettes  Smokeless Tobacco Never Used  Tobacco Comment   Quit 30 years ago    Social History   Substance and Sexual Activity  Alcohol Use Not Currently     Allergies  Allergen Reactions  . Dorzolamide Hcl-Timolol Mal Shortness Of Breath  . Novocain [Procaine] Shortness Of Breath and Other (See Comments)    Current Outpatient Medications  Medication Sig Dispense Refill  . amoxicillin (AMOXIL) 500 MG tablet Take 4 tablets (2,000 mg total) by mouth as directed. 1 hour prior to dental work including cleanings 12 tablet 12  . aspirin EC 81 MG tablet Take 1 tablet (81 mg total) by mouth daily.    . Cholecalciferol (VITAMIN D-3) 125 MCG (5000 UT) TABS Take 5,000 Units by mouth daily.    . clopidogrel (PLAVIX) 75 MG tablet Take 1 tablet (75 mg total) by mouth daily with breakfast. 90 tablet 1  . escitalopram (LEXAPRO) 10 MG tablet Take 10  mg by mouth daily.     . ferrous sulfate 325 (65 FE) MG tablet Take 325 mg by mouth daily with breakfast.    . metoprolol succinate (TOPROL-XL) 25 MG 24 hr tablet Take 25 mg by mouth daily.    . Misc Natural Products (OSTEO BI-FLEX JOINT SHIELD PO) Take 1 tablet by mouth daily.    . Multiple Vitamin (MULTIVITAMIN WITH MINERALS) TABS tablet Take 1 tablet by mouth daily. VitaFusion Women's Multivitamin    . pantoprazole (PROTONIX) 40 MG tablet Take 1 tablet (40 mg total) by mouth daily. 90 tablet 1  . Travoprost, BAK Free, (TRAVATAN) 0.004 % SOLN ophthalmic solution Place 1 drop into both eyes at bedtime.      No current facility-administered medications for this visit.    Review of Systems  Constitutional: Negative.   Respiratory: Positive for shortness of breath.   Cardiovascular: Positive for chest pain.  Gastrointestinal: Positive for abdominal pain and heartburn.  Neurological: Negative.      PHYSICAL EXAMINATION: BP (!) 150/87   Pulse 72   Temp 97.9 F (36.6 C) (Skin)   Resp 20   Ht 5\' 6"  (1.676 m)   Wt 200 lb (90.7 kg)   SpO2 95% Comment: RA  BMI 32.28 kg/m  Physical Exam  Constitutional: She is oriented to person, place, and time. She appears well-developed and well-nourished. No distress.  HENT:  Head: Normocephalic and atraumatic.  Eyes: Conjunctivae are normal.  Cardiovascular: Normal rate and regular rhythm.  No murmur heard. Respiratory: Effort normal. No respiratory distress.  GI: Soft. She exhibits no distension.  No upper abdominal surgical incisions  Musculoskeletal:        General: Normal range of motion.     Cervical back: Normal range of motion.  Neurological: She is alert and oriented to person, place, and time.  Skin: Skin is warm and dry. She is not diaphoretic.    Diagnostic Studies & Laboratory data:    CT Scan: Her CT scan shows a large paraesophageal hernia with the stomach and omentum in her posterior mediastinum      I have  independently reviewed the above radiology studies  and reviewed the findings with the patient.   Recent Lab Findings: Lab Results  Component  Value Date   WBC 11.7 (H) 12/11/2019   HGB 12.1 12/11/2019   HCT 34.9 12/11/2019   PLT 377 12/11/2019   GLUCOSE 114 (H) 12/11/2019   CHOL 221 (A) 06/01/2016   TRIG 123 06/01/2016   HDL 80 (A) 06/01/2016   LDLDIRECT 133.4 12/29/2009   LDLCALC 116 06/01/2016   ALT 14 11/29/2019   AST 16 11/29/2019   NA 134 12/11/2019   K 4.3 12/11/2019   CL 96 12/11/2019   CREATININE 0.77 12/11/2019   BUN 14 12/11/2019   CO2 26 12/11/2019   TSH 2.04 06/01/2016   INR 1.1 11/29/2019   HGBA1C 5.9 (H) 11/29/2019       Assessment / Plan:   86 year old female with a large paraesophageal hernia likely cause of her ongoing dyspnea, reflux and postprandial chest pain.  In regards to her TAVR she will need to be off of the Plavix for least 5 days and I have discussed this with Dr. Cyndia Bent.  She is tentatively scheduled for a EGD, robotic assisted paraesophageal hernia repair and fundoplication.  Depending on the size of the hernia we may use absorbable mesh to buttress the crural repair.  The risks and benefits were discussed in detail and she is agreeable to proceed.     I  spent 40 minutes with  the patient face to face and greater then 50% of the time was spent in counseling and coordination of care.    Lajuana Matte 01/03/2020 5:24 PM

## 2020-01-06 ENCOUNTER — Encounter: Payer: Self-pay | Admitting: *Deleted

## 2020-01-13 NOTE — Progress Notes (Signed)
[place], [place] [address] [place] [address] Phone: [phone] Fax: [phone]  [place], [place] - [address] [place] [address] Phone: [phone] Fax: [phone]      Your procedure is scheduled on Friday, Jan 17, 2020.  Report to [facility] Main Entrance "A" at 5:30 A.M., and check in at the Admitting office.  Call this number if you have problems the morning of surgery:  [phone]  Call [phone] if you have any questions prior to your surgery date Monday-Friday 8am-4pm    Remember:  Do not eat or drink after midnight the night before your surgery    Take these medicines the morning of surgery with A SIP OF WATER:  escitalopram (LEXAPRO) metoprolol succinate (TOPROL-XL) pantoprazole (PROTONIX)   Follow your surgeon's instructions on when to stop Aspirin and Plavix.  If no instructions were given by your surgeon then you will need to call the office to get those instructions.     As of today, STOP taking any Aspirin containing products, Aleve, Naproxen, Ibuprofen, Motrin, Advil, Goody's, BC's, all herbal medications, fish oil, and all vitamins.                      Do not wear jewelry, make up, or nail polish.            Do not wear lotions, powders, perfumes, or deodorant.            Do not shave 48 hours prior to surgery.            Do not bring valuables to the hospital.            [facility] is not responsible for any belongings or valuables.  Do NOT Smoke (Tobacco/Vapping) or drink Alcohol 24 hours prior to your procedure If you use a CPAP at night, you may bring all equipment for your overnight stay.   Contacts, glasses, dentures or bridgework may not be worn into surgery.      For patients admitted to the hospital, discharge time will be determined by your treatment team.   Patients discharged the day of surgery will not be allowed to  drive home, and someone needs to stay with them for 24 hours.    Special instructions:   [place]- Preparing For Surgery  Before surgery, you can play an important role. Because skin is not sterile, your skin needs to be as free of germs as possible. You can reduce the number of germs on your skin by washing with CHG (chlorahexidine gluconate) Soap before surgery.  CHG is an antiseptic cleaner which kills germs and bonds with the skin to continue killing germs even after washing.    Oral Hygiene is also important to reduce your risk of infection.  Remember - BRUSH YOUR TEETH THE MORNING OF SURGERY WITH YOUR REGULAR TOOTHPASTE  Please do not use if you have an allergy to CHG or antibacterial soaps. If your skin becomes reddened/irritated stop using the CHG.  Do not shave (including legs and underarms) for at least 48 hours prior to first CHG shower. It is OK to shave your face.  Please follow these instructions carefully.   1. Shower the NIGHT BEFORE SURGERY and the MORNING OF SURGERY with CHG Soap.   2. If you chose to wash your hair, wash your hair first as usual with your normal shampoo.  3. After  you shampoo, rinse your hair and body thoroughly to remove the shampoo.  4. Use CHG as you would any other liquid soap. You can apply CHG directly to the skin and wash gently with a scrungie or a clean washcloth.   5. Apply the CHG Soap to your body ONLY FROM THE NECK DOWN.  Do not use on open wounds or open sores. Avoid contact with your eyes, ears, mouth and genitals (private parts). Wash Face and genitals (private parts)  with your normal soap.   6. Wash thoroughly, paying special attention to the area where your surgery will be performed.  7. Thoroughly rinse your body with warm water from the neck down.  8. DO NOT shower/wash with your normal soap after using and rinsing off the CHG Soap.  9. Pat yourself dry with a CLEAN TOWEL.  10. Wear CLEAN PAJAMAS to bed the night before  surgery, wear comfortable clothes the morning of surgery  11. Place CLEAN SHEETS on your bed the night of your first shower and DO NOT SLEEP WITH PETS.   Day of Surgery:   Do not apply any deodorants/lotions.  Please wear clean clothes to the hospital/surgery center.   Remember to brush your teeth WITH YOUR REGULAR TOOTHPASTE.   Please read over the following fact sheets that you were given.

## 2020-01-14 ENCOUNTER — Encounter (HOSPITAL_COMMUNITY): Payer: Self-pay

## 2020-01-14 ENCOUNTER — Ambulatory Visit (HOSPITAL_COMMUNITY)
Admission: RE | Admit: 2020-01-14 | Discharge: 2020-01-14 | Disposition: A | Discharge status: Home / Self Care | Payer: Medicare PPO | Source: Ambulatory Visit | Attending: Thoracic Surgery (Cardiothoracic Vascular Surgery) | Admitting: Thoracic Surgery (Cardiothoracic Vascular Surgery)

## 2020-01-14 ENCOUNTER — Other Ambulatory Visit (HOSPITAL_COMMUNITY)
Admission: RE | Admit: 2020-01-14 | Discharge: 2020-01-14 | Disposition: A | Discharge status: Home / Self Care | Payer: Medicare PPO | Source: Ambulatory Visit | Attending: Thoracic Surgery (Cardiothoracic Vascular Surgery) | Admitting: Thoracic Surgery (Cardiothoracic Vascular Surgery)

## 2020-01-14 ENCOUNTER — Encounter (HOSPITAL_COMMUNITY)
Admission: RE | Admit: 2020-01-14 | Discharge: 2020-01-14 | Disposition: A | Discharge status: Home / Self Care | Payer: Medicare PPO | Source: Ambulatory Visit | Attending: Thoracic Surgery (Cardiothoracic Vascular Surgery) | Admitting: Thoracic Surgery (Cardiothoracic Vascular Surgery)

## 2020-01-14 ENCOUNTER — Other Ambulatory Visit: Payer: Self-pay

## 2020-01-14 DIAGNOSIS — I700 Atherosclerosis of aorta: Secondary | ICD-10-CM | POA: Diagnosis not present

## 2020-01-14 DIAGNOSIS — Z7902 Long term (current) use of antithrombotics/antiplatelets: Secondary | ICD-10-CM | POA: Diagnosis not present

## 2020-01-14 DIAGNOSIS — Z01818 Encounter for other preprocedural examination: Secondary | ICD-10-CM | POA: Diagnosis not present

## 2020-01-14 DIAGNOSIS — D649 Anemia, unspecified: Secondary | ICD-10-CM | POA: Insufficient documentation

## 2020-01-14 DIAGNOSIS — I10 Essential (primary) hypertension: Secondary | ICD-10-CM | POA: Insufficient documentation

## 2020-01-14 DIAGNOSIS — K449 Diaphragmatic hernia without obstruction or gangrene: Secondary | ICD-10-CM | POA: Diagnosis present

## 2020-01-14 DIAGNOSIS — Z7982 Long term (current) use of aspirin: Secondary | ICD-10-CM | POA: Insufficient documentation

## 2020-01-14 DIAGNOSIS — F419 Anxiety disorder, unspecified: Secondary | ICD-10-CM | POA: Diagnosis not present

## 2020-01-14 DIAGNOSIS — Z85828 Personal history of other malignant neoplasm of skin: Secondary | ICD-10-CM | POA: Insufficient documentation

## 2020-01-14 DIAGNOSIS — K219 Gastro-esophageal reflux disease without esophagitis: Secondary | ICD-10-CM | POA: Diagnosis not present

## 2020-01-14 DIAGNOSIS — J90 Pleural effusion, not elsewhere classified: Secondary | ICD-10-CM | POA: Diagnosis not present

## 2020-01-14 DIAGNOSIS — Z20822 Contact with and (suspected) exposure to covid-19: Secondary | ICD-10-CM | POA: Diagnosis not present

## 2020-01-14 DIAGNOSIS — Z87891 Personal history of nicotine dependence: Secondary | ICD-10-CM | POA: Diagnosis not present

## 2020-01-14 DIAGNOSIS — Z79899 Other long term (current) drug therapy: Secondary | ICD-10-CM | POA: Diagnosis not present

## 2020-01-14 HISTORY — DX: Dyspnea, unspecified: R06.00

## 2020-01-14 LAB — URINALYSIS, ROUTINE W REFLEX MICROSCOPIC
Bacteria, UA: NONE SEEN
Bilirubin Urine: NEGATIVE
Glucose, UA: NEGATIVE mg/dL
Hgb urine dipstick: NEGATIVE
Ketones, ur: NEGATIVE mg/dL
Nitrite: NEGATIVE
Protein, ur: NEGATIVE mg/dL
Specific Gravity, Urine: 1.017 (ref 1.005–1.030)
pH: 6 (ref 5.0–8.0)

## 2020-01-14 LAB — TYPE AND SCREEN
ABO/RH(D): A POS
Antibody Screen: NEGATIVE

## 2020-01-14 LAB — CBC
HCT: 38.6 % (ref 36.0–46.0)
Hemoglobin: 12.1 g/dL (ref 12.0–15.0)
MCH: 28.7 pg (ref 26.0–34.0)
MCHC: 31.3 g/dL (ref 30.0–36.0)
MCV: 91.5 fL (ref 80.0–100.0)
Platelets: 272 10*3/uL (ref 150–400)
RBC: 4.22 MIL/uL (ref 3.87–5.11)
RDW: 13.8 % (ref 11.5–15.5)
WBC: 7.8 10*3/uL (ref 4.0–10.5)
nRBC: 0 % (ref 0.0–0.2)

## 2020-01-14 LAB — COMPREHENSIVE METABOLIC PANEL
ALT: 11 U/L (ref 0–44)
AST: 15 U/L (ref 15–41)
Albumin: 3.2 g/dL — ABNORMAL LOW (ref 3.5–5.0)
Alkaline Phosphatase: 40 U/L (ref 38–126)
Anion gap: 11 (ref 5–15)
BUN: 12 mg/dL (ref 8–23)
CO2: 24 mmol/L (ref 22–32)
Calcium: 9.2 mg/dL (ref 8.9–10.3)
Chloride: 100 mmol/L (ref 98–111)
Creatinine, Ser: 0.73 mg/dL (ref 0.44–1.00)
GFR calc Af Amer: >60 mL/min (ref >60)
GFR calc non Af Amer: >60 mL/min (ref >60)
Glucose, Bld: 111 mg/dL — ABNORMAL HIGH (ref 70–99)
Potassium: 4.1 mmol/L (ref 3.5–5.1)
Sodium: 135 mmol/L (ref 135–145)
Total Bilirubin: 0.5 mg/dL (ref 0.3–1.2)
Total Protein: 6.9 g/dL (ref 6.5–8.1)

## 2020-01-14 LAB — BLOOD GAS, ARTERIAL
Acid-Base Excess: 1.7 mmol/L (ref 0.0–2.0)
Bicarbonate: 25.4 mmol/L (ref 20.0–28.0)
FIO2: 21
O2 Saturation: 96.5 %
Patient temperature: 37
pCO2 arterial: 36.9 mmHg (ref 32.0–48.0)
pH, Arterial: 7.452 — ABNORMAL HIGH (ref 7.350–7.450)
pO2, Arterial: 81 mmHg — ABNORMAL LOW (ref 83.0–108.0)

## 2020-01-14 LAB — PROTIME-INR
INR: 1.1 (ref 0.8–1.2)
Prothrombin Time: 13.6 seconds (ref 11.4–15.2)

## 2020-01-14 LAB — SARS CORONAVIRUS 2 (TAT 6-24 HRS): SARS Coronavirus 2: NEGATIVE

## 2020-01-14 LAB — SURGICAL PCR SCREEN
MRSA, PCR: NEGATIVE
Staphylococcus aureus: NEGATIVE

## 2020-01-14 LAB — APTT: aPTT: 40 seconds — ABNORMAL HIGH (ref 24–36)

## 2020-01-14 NOTE — Progress Notes (Signed)
PCP - Dr. Moshe Cipro Cardiologist - Dr. Thompson Grayer Pulmonary: Dr. Linde Gillis  PPM/ICD - Denies  Chest x-ray - 01/14/20 EKG - 01/14/20 Stress Test - 11/04/16 ECHO - 12/26/19 Cardiac Cath - 10/29/19  Sleep Study - No  Pt denies being diabetic.  Blood Thinner Instructions: Plavix LD - 01/11/20 Aspirin Instructions: Continue as prescribed.  ERAS Protcol - No  COVID TEST- 01/14/20   Coronavirus Screening  Have you experienced the following symptoms:  Cough yes/no: No Fever (>100.67F)  yes/no: No Runny nose yes/no: No Sore throat yes/no: No Difficulty breathing/shortness of breath  yes/no: No  Have you or a family member traveled in the last 14 days and where? yes/no: No   If the patient indicates "YES" to the above questions, their PAT will be rescheduled to limit the exposure to others and, the surgeon will be notified. THE PATIENT WILL NEED TO BE ASYMPTOMATIC FOR 14 DAYS.   If the patient is not experiencing any of these symptoms, the PAT nurse will instruct them to NOT bring anyone with them to their appointment since they may have these symptoms or traveled as well.   Please remind your patients and families that hospital visitation restrictions are in effect and the importance of the restrictions.     Anesthesia review: Yes, cardiac hx  Patient denies shortness of breath, fever, cough and chest pain at PAT appointment   All instructions explained to the patient, with a verbal understanding of the material. Patient agrees to go over the instructions while at home for a better understanding. Patient also instructed to self quarantine after being tested for COVID-19. The opportunity to ask questions was provided.

## 2020-01-14 NOTE — Progress Notes (Signed)
Sent IB to Charlena Cross, RN regarding pt's UA results.

## 2020-01-15 ENCOUNTER — Encounter (HOSPITAL_COMMUNITY): Payer: Self-pay | Admitting: Vascular Surgery

## 2020-01-15 NOTE — Progress Notes (Signed)
Anesthesia Chart Review:  Case: [id number] Date/Time: 01/17/20 0715   Procedure: XI ROBOTIC ASSISTED HIATAL HERNIA REPAIR (N/A Chest)   Anesthesia type: General   Diagnosis: Hiatal hernia [K44.9]   Pre-op diagnosis: paraesophageal hernia   [facility] RO[facility] 10 / [facility] OR   Surgeons: Lajuana Matte, MD      DISCUSSION: DISCUSSION: Patient is an 86 year old female scheduled for the above procedure. She underwent TAVR on 12/03/19 by Sherren Mocha, ,MD and Gilford Raid, MD. Uncomplicated post-operative course, but with continued dyspnea (worse post-prandial) and severe GERD that were felt to be related to known large paraesophageal hernia. Dr. Cyndia Bent referred her to Dr. Kipp Brood for intervention. She is to be on Plavix for six months post-TAVR; however, she will need to hold Plavix for 5 days prior to this surgery which Dr. Kipp Brood discussed with Dr. Cyndia Bent. She is continuing ASA.  History includes former smoker, severe AS (s/p TAVR, 26 mm 12/03/19), HTN, GERD, hiatal hernia, cancer (vulvar, skin), anemia. BMI is consistent with obesity.  Reported last Plavix 01/11/20. 01/14/20 COVID-19 test negative. Anesthesia team to evaluate on the day of surgery.    VS: BP 132/76   Pulse 67   Temp 36.8 C (Oral)   Resp 18   Wt 90.9 kg   SpO2 98%   BMI 32.35 kg/m    PROVIDERS: Moshe Cipro, MD is PCP - Thompson Grayer, MD is cardiologist. She has primarily been seeing Truitt Merle, NP with general cardiology and now Copper, Legrand Como, MD for TAVR. Linde Gillis, MD is pulmonoloigst   LABS: Labs reviewed: Acceptable for surgery. PTT 40, previously 35 last month.  (all labs ordered are listed, but only abnormal results are displayed)  Labs Reviewed  APTT - Abnormal; Notable for the following components:      Result Value   aPTT 40 (*)    All other components within normal limits  BLOOD GAS, ARTERIAL - Abnormal; Notable for the following components:   pH, Arterial 7.452 (*)    pO2, Arterial 81.0 (*)    All other components within normal limits  COMPREHENSIVE METABOLIC PANEL - Abnormal; Notable for the following components:   Glucose, Bld 111 (*)    Albumin 3.2 (*)    All other components within normal limits  URINALYSIS, ROUTINE W REFLEX MICROSCOPIC - Abnormal; Notable for the following components:   APPearance HAZY (*)    Leukocytes,Ua TRACE (*)    All other components within normal limits  SURGICAL PCR SCREEN  CBC  PROTIME-INR  TYPE AND SCREEN     IMAGES: CXR 01/14/20: IMPRESSION: Left pleural effusion with left base atelectasis. Lungs otherwise clear. Large paraesophageal type hernia. Heart upper normal in size. Status post aortic valve replacement. Aortic Atherosclerosis (ICD10-I70.0).   EKG: 01/14/20: Normal sinus rhythm Nonspecific ST and T wave abnormality Abnormal ECG No significant change since 12/04/19 Confirmed by Jenkins Rouge [phone]) on 01/14/2020 10:25:16 AM   CV: Echo 12/26/19: IMPRESSIONS  1. Left ventricular ejection fraction, by estimation, is 60 to 65%. The  left ventricle has normal function. The left ventricle has no regional  wall motion abnormalities. There is mild left ventricular hypertrophy.  Left ventricular diastolic parameters  are consistent with Grade II diastolic dysfunction (pseudonormalization).  Elevated left ventricular end-diastolic pressure. The average left  ventricular global longitudinal strain is -21.7 %. The global longitudinal  strain is normal.  2. Right ventricular systolic function is normal. The right ventricular  size is normal. There is normal  pulmonary artery systolic pressure. The  estimated right ventricular systolic pressure is 0000000 mmHg.  3. Left atrial size was mild to moderately dilated.  4. The mitral valve is abnormal. Trivial mitral valve regurgitation.  5. The aortic valve has been repaired/replaced. Aortic valve  regurgitation is not visualized. There is a 26 mm Edwards  Edwards Sapien  prosthetic (TAVR) valve present in the aortic position. Procedure Date:  12/04/19. Echo findings are consistent with  normal structure and function of the aortic valve prosthesis. Aortic valve  area, by VTI measures 1.77 cm. Aortic valve mean gradient measures 11.0  mmHg. Aortic valve Vmax measures 2.12 m/s.  6. The inferior vena cava is dilated in size with >50% respiratory  variability, suggesting right atrial pressure of 8 mmHg.    Carotid US 11/13/19: Summary:  Right Carotid: Velocities in the right ICA are consistent with a 1-39%  stenosis.  Left Carotid: Velocities in the left ICA are consistent with a 1-39%  stenosis.  Vertebrals: Bilateral vertebral arteries demonstrate antegrade flow.  Subclavians: Normal flow hemodynamics were seen in bilateral subclavian        arteries.    CT Coronary 11/13/19: IMPRESSION: 1. Severe calcific arotic stenosis. 2. Annular measurements appropriate for 26 mm Edwards Sapien 3 (12.6% oversizing). No annular or sub-annular calcifications. 3. Sufficient coronary to annulus distance. 4. Optimal Fluoroscopic Angle for Delivery: RAO 2 CAU 3 5. No thrombus in the left atrial appendage.   Cardiac cath 10/29/19 (Pre-TAVR):  Calcific aortic stenosis, moderately severe with peak to peak gradient 36 mmHg and mean gradient 28 mmHg. Calculated valve area 1.09 cm  20% distal left main  Irregularities up to 30% in the proximal to mid LAD. First diagonal contains proximal 50% narrowing.  The circumflex is dominant. Luminal irregularities are noted.  Nondominant right coronary. Widely patent vessel.  LVEDP 16 mmHg RECOMMENDATIONS:  Refer to structural heart clinic.   Dyspnea is out of proportion to hemodynamics. Other considerations include diastolic heart failure in addition to aortic stenosis.   Past Medical History:  Diagnosis Date  . Anemia   . Anxiety   . Dyspnea   . GERD (gastroesophageal reflux  disease)   . Hiatal hernia   . Hypertension   . Insomnia   . Osteopenia   . Right knee DJD   . 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 Burt Knack and Dr. Cyndia Bent  . Severe aortic stenosis   . Skin cancer   . Vulvar cancer [facility])     Past Surgical History:  Procedure Laterality Date  . COSMETIC SURGERY    . EYE SURGERY     glaucoma 4-5 years ago  . RIGHT/LEFT HEART CATH AND CORONARY ANGIOGRAPHY N/A 10/29/2019   Procedure: RIGHT/LEFT HEART CATH AND CORONARY ANGIOGRAPHY;  Surgeon: Belva Crome, MD;  Location: [place] CV LAB;  Service: Cardiovascular;  Laterality: N/A;  . TEE WITHOUT CARDIOVERSION N/A 12/03/2019   Procedure: TRANSESOPHAGEAL ECHOCARDIOGRAM (TEE);  Surgeon: Sherren Mocha, MD;  Location: [place] CV LAB;  Service: Open Heart Surgery;  Laterality: N/A;  . TONSILLECTOMY AND ADENOIDECTOMY    . TRANSCATHETER AORTIC VALVE REPLACEMENT, TRANSFEMORAL N/A 12/03/2019   Procedure: TRANSCATHETER AORTIC VALVE REPLACEMENT, TRANSFEMORAL;  Surgeon: Sherren Mocha, MD;  Location: [place] CV LAB;  Service: Open Heart Surgery;  Laterality: N/A;  . TUBAL LIGATION    . VULVA SURGERY      MEDICATIONS: .  amoxicillin (AMOXIL) 500 MG tablet  . aspirin EC 81 MG tablet  . Cholecalciferol (VITAMIN D-3) 125 MCG (5000 UT) TABS  . clopidogrel (PLAVIX) 75 MG tablet  . escitalopram (LEXAPRO) 10 MG tablet  . ferrous sulfate 325 (65 FE) MG tablet  . metoprolol succinate (TOPROL-XL) 25 MG 24 hr tablet  . Misc Natural Products (OSTEO BI-FLEX JOINT SHIELD PO)  . Multiple Vitamin (MULTIVITAMIN WITH MINERALS) TABS tablet  . pantoprazole (PROTONIX) 40 MG tablet  . Travoprost, BAK Free, (TRAVATAN) 0.004 % SOLN ophthalmic solution   No current facility-administered medications for this encounter.    Myra Gianotti, PA-C Surgical Short Stay/Anesthesiology [facility] Phone [phone] [facility] Phone [phone] 01/15/2020  9:10 AM

## 2020-01-15 NOTE — Anesthesia Preprocedure Evaluation (Deleted)
Anesthesia Evaluation Anesthesia Physical Anesthesia Plan  ASA:   Anesthesia Plan:    Post-op Pain Management:    Induction:   PONV Risk Score and Plan:   Airway Management Planned:   Additional Equipment:   Intra-op Plan:   Post-operative Plan:   Informed Consent:   Plan Discussed with:   Anesthesia Plan Comments: (PAT note written 01/15/2020 by Myra Gianotti, PA-C. )        Anesthesia Quick Evaluation

## 2020-01-16 ENCOUNTER — Other Ambulatory Visit: Payer: Self-pay | Admitting: *Deleted

## 2020-01-16 ENCOUNTER — Telehealth: Payer: Self-pay

## 2020-01-16 NOTE — Telephone Encounter (Signed)
Mrs Rea called to cancel surgery scheduled for 01/17/20 with Dr Lightfoot/ she will call back to reschedule in the future/. Insurance is with [employer]

## 2020-01-16 NOTE — Progress Notes (Signed)
Patient called and stated that she wanted to postpone her surgery.  Patient instructed to call Dr. Abran Duke office.

## 2020-01-17 ENCOUNTER — Encounter (HOSPITAL_COMMUNITY): Admission: RE | Payer: Self-pay | Source: Home / Self Care

## 2020-01-17 ENCOUNTER — Ambulatory Visit (HOSPITAL_COMMUNITY)
Admission: RE | Admit: 2020-01-17 | Payer: Medicare PPO | Source: Home / Self Care | Admitting: Thoracic Surgery (Cardiothoracic Vascular Surgery)

## 2020-01-17 DIAGNOSIS — K449 Diaphragmatic hernia without obstruction or gangrene: Secondary | ICD-10-CM

## 2020-01-17 SURGERY — Surgical Case
Anesthesia: *Unknown

## 2020-01-17 SURGERY — REPAIR, HERNIA, HIATAL, ROBOT-ASSISTED
Anesthesia: General | Site: Chest

## 2020-01-22 ENCOUNTER — Encounter: Payer: Self-pay | Admitting: Internal Medicine

## 2020-03-04 ENCOUNTER — Encounter: Payer: Self-pay | Admitting: Internal Medicine

## 2020-03-23 ENCOUNTER — Other Ambulatory Visit: Payer: Self-pay | Admitting: Physician Assistant

## 2020-04-09 ENCOUNTER — Telehealth: Payer: Self-pay | Admitting: Internal Medicine

## 2020-04-09 NOTE — Telephone Encounter (Signed)
Left message for the pt she will need to have the dental office fax over a clearance request. Once clearance request received will have pre op team review for clearance. Left our fax # [phone].

## 2020-04-09 NOTE — Telephone Encounter (Signed)
Patient wants to get some overdue dental work done. She wanted to know if she would need special clearance now with her other heart condition. Please advise

## 2020-04-13 ENCOUNTER — Ambulatory Visit: Payer: Medicare PPO | Admitting: Nurse Practitioner

## 2020-04-22 ENCOUNTER — Telehealth: Payer: Self-pay | Admitting: Internal Medicine

## 2020-04-22 NOTE — Telephone Encounter (Signed)
   [facility] Pre-operative Risk Assessment    Request for surgical clearance:  1. What type of surgery is being performed? Tooth Extraction - just one tooth.   2. When is this surgery scheduled? 05/18/20  3. What type of clearance is required (medical clearance vs. Pharmacy clearance to hold med vs. Both)? Both  4. Are there any medications that need to be held prior to surgery and how long? They aren't aware of any specific meds to be held, they are looking for that info. The office isn't sure if she is on a blood thinner - they aren't sure if patient is aware if she is on a blood thinner as well.    5. Practice name and name of physician performing surgery? [facility] - Dr. Orpah Clinton   6. What is your office phone number? [phone]   7.   What is your office fax number? [phone]  8.   Anesthesia type (None, local, MAC, general)? When I asked Velta Addison about anesthesia type, she advised patient would be on versed, not containing epinephrine    Havanah Nelms Lestine Mount 04/22/2020, 4:50 PM  _________________________________________________________________   (provider comments below)    Will be IV sedated - want to know if it is ok for patient to be sedated.

## 2020-04-23 NOTE — Telephone Encounter (Signed)
2ND clearance request was received today. ADDENDUM: ANESTHESIA IS IV SEDATION WITH VERSED. I did call and confirm it is just 1 tooth to be extracted.

## 2020-04-23 NOTE — Telephone Encounter (Signed)
   Primary Cardiologist: Thompson Grayer, MD  Chart reviewed as part of pre-operative protocol coverage.   Simple dental extractions are considered low risk procedures per guidelines and generally do not require any specific cardiac clearance. It is also generally accepted that for simple extractions and dental cleanings, there is no need to interrupt blood thinner therapy.    R/L [facility] 10/2019 negative for obstructive CAD. She is s/p TAVR and should continue ASA and plavix without interruption. She denies anginal symptoms. Breathing is much improved on new inhalers from pulmonology. No further cardiac testing required. She is cleared for tooth extraction.    SBE prophylaxis is required for the patient. I reviewed those instructions and she confirms she has there prescription.   I will route this recommendation to the requesting party via Epic fax function and remove from pre-op pool.  Please call with questions.  Tami Lin Christoher Drudge, PA 04/23/2020, 9:35 AM

## 2020-05-25 ENCOUNTER — Other Ambulatory Visit: Payer: Self-pay | Admitting: Physician Assistant

## 2020-06-01 ENCOUNTER — Ambulatory Visit: Payer: Medicare PPO | Admitting: Internal Medicine

## 2020-08-20 ENCOUNTER — Other Ambulatory Visit: Payer: Self-pay

## 2020-08-20 ENCOUNTER — Ambulatory Visit: Payer: Medicare PPO | Admitting: Internal Medicine

## 2020-08-20 ENCOUNTER — Encounter: Payer: Self-pay | Admitting: Internal Medicine

## 2020-08-20 VITALS — BP 110/66 | HR 69 | Ht 66.0 in | Wt 210.6 lb

## 2020-08-20 DIAGNOSIS — Z952 Presence of prosthetic heart valve: Secondary | ICD-10-CM | POA: Diagnosis not present

## 2020-08-20 DIAGNOSIS — R0602 Shortness of breath: Secondary | ICD-10-CM

## 2020-08-20 NOTE — Patient Instructions (Addendum)
Medication Instructions:  Your physician has recommended you make the following change in your medication:  1.  STOP taking clopidogrel (Plavix)  2.  STOP taking metoprolol succinate.  Labwork: None ordered.  Testing/Procedures: Your physician has recommended that you have a cardiopulmonary stress test (CPX). CPX testing is a non-invasive measurement of heart and lung function. It replaces a traditional treadmill stress test. This type of test provides a tremendous amount of information that relates not only to your present condition but also for future outcomes. This test combines measurements of you ventilation, respiratory gas exchange in the lungs, electrocardiogram (EKG), blood pressure and physical response before, during, and following an exercise protocol.   Follow up as needed.  Any Other Special Instructions Will Be Listed Below (If Applicable).  If you need a refill on your cardiac medications before your next appointment, please call your pharmacy.

## 2020-08-20 NOTE — Progress Notes (Signed)
PCP: Romeo Rabon, MD Primary Cardiologist: Dr Excell Seltzer Primary EP: Dr Audrie Lia is a 85 y.o. female who presents today for electrophysiology followup.  Since last being seen in our clinic, the patient reports doing very well.  She is s/p TAVR.  She was evaluated for hiatal hernia surgery but decided to not proceed.  Her primary concern is with SOB.  This is primarily exertional and chronic. Today, she denies symptoms of palpitations, chest pain, lower extremity edema, dizziness, presyncope, or syncope.  The patient is otherwise without complaint today.   Past Medical History:  Diagnosis Date  . Anemia   . Anxiety   . Dyspnea   . GERD (gastroesophageal reflux disease)   . Hiatal hernia   . Hypertension   . Insomnia   . Osteopenia   . Right knee DJD   . 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 Excell Seltzer and Dr. Laneta Simmers  . Severe aortic stenosis   . Skin cancer   . Vulvar cancer [facility])    Past Surgical History:  Procedure Laterality Date  . COSMETIC SURGERY    . EYE SURGERY     glaucoma 4-5 years ago  . RIGHT/LEFT HEART CATH AND CORONARY ANGIOGRAPHY N/A 10/29/2019   Procedure: RIGHT/LEFT HEART CATH AND CORONARY ANGIOGRAPHY;  Surgeon: Lyn Records, MD;  [facility] IN[facility] LAB;  Service: Cardiovascular;  Laterality: N/A;  . TEE WITHOUT CARDIOVERSION N/A 12/03/2019   Procedure: TRANSESOPHAGEAL ECHOCARDIOGRAM (TEE);  Surgeon: Tonny Bollman, MD;  Location: [facility] INVASIVE CV LAB;  Service: Open Heart Surgery;  Laterality: N/A;  . TONSILLECTOMY AND ADENOIDECTOMY    . TRANSCATHETER AORTIC VALVE REPLACEMENT, TRANSFEMORAL N/A 12/03/2019   Procedure: TRANSCATHETER AORTIC VALVE REPLACEMENT, TRANSFEMORAL;  Surgeon: Tonny Bollman, MD;  Location: [facility] INVASIVE CV LAB;  Service: Open Heart Surgery;  Laterality: N/A;  . TUBAL LIGATION    . VULVA SURGERY      ROS- all systems are reviewed and negatives  except as per HPI above  Current Outpatient Medications  Medication Sig Dispense Refill  . amoxicillin (AMOXIL) 500 MG tablet Take 4 tablets (2,000 mg total) by mouth as directed. 1 hour prior to dental work including cleanings 12 tablet 12  . aspirin EC 81 MG tablet Take 1 tablet (81 mg total) by mouth daily.    . Cholecalciferol (VITAMIN D-3) 125 MCG (5000 UT) TABS Take 5,000 Units by mouth daily.    . clopidogrel (PLAVIX) 75 MG tablet TAKE 1 TABLET IN THE MORNING WITH FOOD 90 tablet 2  . escitalopram (LEXAPRO) 10 MG tablet Take 10 mg by mouth daily.     . ferrous sulfate 325 (65 FE) MG tablet Take 325 mg by mouth daily with breakfast.    . metoprolol succinate (TOPROL-XL) 25 MG 24 hr tablet Take 25 mg by mouth daily.    . Misc Natural Products (OSTEO BI-FLEX JOINT SHIELD PO) Take 1 tablet by mouth daily.    . Multiple Vitamin (MULTIVITAMIN WITH MINERALS) TABS tablet Take 1 tablet by mouth daily. VitaFusion Women's Multivitamin    . pantoprazole (PROTONIX) 40 MG tablet TAKE 1 TABLET BY MOUTH ONCE DAILY. 30 tablet 6  . Travoprost, BAK Free, (TRAVATAN) 0.004 % SOLN ophthalmic solution Place 1 drop into both eyes at bedtime.     Marland Kitchen umeclidinium-vilanterol (ANORO ELLIPTA) 62.5-25 MCG/INH AEPB Inhale 1 puff into the lungs daily.  No current facility-administered medications for this visit.    Physical Exam: Vitals:   08/20/20 1532  BP: 110/66  Pulse: 69  SpO2: 95%  Weight: 210 lb 9.6 oz (95.5 kg)  Height: 5\' 6"  (1.676 m)    GEN- The patient is well appearing, alert and oriented x 3 today.   Head- normocephalic, atraumatic Eyes-  Sclera clear, conjunctiva pink Ears- hearing intact Oropharynx- clear Lungs- Clear to ausculation bilaterally, normal work of breathing Heart- Regular rate and rhythm, no murmurs, rubs or gallops, PMI not laterally displaced GI- soft, NT, ND, + BS Extremities- no clubbing, cyanosis, or edema  Wt Readings from Last 3 Encounters:  08/20/20 210 lb 9.6 oz  (95.5 kg)  01/14/20 200 lb 6.4 oz (90.9 kg)  01/03/20 200 lb (90.7 kg)    EKG tracing ordered today is personally reviewed and shows sinus rhythm  Assessment and Plan:  1. Aortic stenosis S/p TAVR Echo reviewed Stop plavix   2. Chronic diastolic dysfunction Stable No change required today Does not appear to be volume overloaded today  3. SOB Unclear etiology Likely respiratory and deconditioning Regular exercise and weight loss are advised Stop metoprolol CPX is ordered  4. Obesity Body mass index is 33.99 kg/m. We discussed lifestyle modification at length today  Risks, benefits and potential toxicities for medications prescribed and/or refilled reviewed with patient today.   Return to see me prn Follow-up with structural heart team as scheduled  Thompson Grayer MD, [facility] 08/20/2020 3:47 PM

## 2020-08-25 ENCOUNTER — Encounter: Payer: Self-pay | Admitting: Internal Medicine

## 2020-10-05 ENCOUNTER — Other Ambulatory Visit: Payer: Self-pay

## 2020-10-05 ENCOUNTER — Ambulatory Visit (HOSPITAL_COMMUNITY): Payer: Medicare PPO | Attending: Internal Medicine

## 2020-10-05 DIAGNOSIS — R0602 Shortness of breath: Secondary | ICD-10-CM

## 2020-10-12 ENCOUNTER — Telehealth: Payer: Self-pay | Admitting: Internal Medicine

## 2020-10-12 NOTE — Telephone Encounter (Signed)
Notified the patient that her results were not finalized yet and that I would call her with the results when available.

## 2020-10-12 NOTE — Telephone Encounter (Signed)
Patient calling to see if Dr. Rayann Heman has the results from her test 2/7. Please call with results

## 2020-10-26 ENCOUNTER — Other Ambulatory Visit: Payer: Self-pay | Admitting: Physician Assistant

## 2020-11-27 NOTE — Progress Notes (Signed)
HEART AND [place]                                       Cardiology Office Note    Date:  12/02/2020   ID:  Mackenzie Jensen, DOB [birth year], MRN [id number]  PCP:  Moshe Cipro, MD  Cardiologist:  Dr Rayann Heman / Dr. Burt Knack & Dr. Cyndia Bent (TAVR)  CC: 1 year s/p TAVR  History of Present Illness:  Mackenzie Jensen is a 87 y.o. female with a history of large hiatal hernia, GERD, anxiety, insomnia, obesity, and severe aortic stenosis s/p TAVR (12/03/19) who presents to clinic for follow up.   Pt has been followed for aortic stenosis since 2018. Last year she reported progressive dyspnea on exertion and fatigue and echo showed progression of aortic stenosis to severe. Diagnostic cath on 10/29/19 showed mild non obst CAD.  She was evaluated by the multidisciplinary valve team and underwent successful TAVR with a 26 mm Edwards Sapien 3 THV via the TF approach on 12/03/19. Post operative echo showed EF 65-70%, normally functioning TAVR with a mean gradient of 12 mm Hg and no PVL. She was discharged home on aspirin and plavix. She has had continued dyspnea since TAVR. Follow up labs including BNP were normal. 1 month echo showed Echo showd EF 60%, normally functioning TAVR with a mean gradient of 65mmHg and no PVL. Dyspnea felt to be potentially related to her extremely large hiatal hernia. She was evaluated by Dr. Kipp Brood for resection of this and set up for surgery but the patient decided not to proceed.   She was last seen by Dr. Rayann Heman in 12/21 and diet and exercise were recommended. He felt chronic dyspnea was from deconditioning. Recent chest CT showed complete herniation of stomach into chest.   Today she presents to clinic for follow up. Here with daughter. Working with PT and trying to lose weight. She still has stignificnt dyspnea with class III symptoms. Dyspnea limits most of her physcial activity. Breathing does seem to improve with she loses weight. Cannot  walk one block on level ground. No chest pain. No LE edema, orthopnea or PND. NO dizziness or syncope. Is interested in getting a second opinion of hiatal hernia at [facility].   Past Medical History:  Diagnosis Date  . Anemia   . Anxiety   . Dyspnea   . GERD (gastroesophageal reflux disease)   . Hiatal hernia   . Hypertension   . Insomnia   . Osteopenia   . Right knee DJD   . 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 Burt Knack and Dr. Cyndia Bent  . Severe aortic stenosis   . Skin cancer   . Vulvar cancer [facility])     Past Surgical History:  Procedure Laterality Date  . COSMETIC SURGERY    . EYE SURGERY     glaucoma 4-5 years ago  . RIGHT/LEFT HEART CATH AND CORONARY ANGIOGRAPHY N/A 10/29/2019   Procedure: RIGHT/LEFT HEART CATH AND CORONARY ANGIOGRAPHY;  Surgeon: Belva Crome, MD;  Location: [place] [facility];  Service: Cardiovascular;  Laterality: N/A;  . TEE WITHOUT CARDIOVERSION N/A 12/03/2019   Procedure: TRANSESOPHAGEAL ECHOCARDIOGRAM (TEE);  Surgeon: Sherren Mocha, MD;  Location: [place] [facility];  Service: Open Heart Surgery;  Laterality: N/A;  .  TONSILLECTOMY AND ADENOIDECTOMY    . TRANSCATHETER AORTIC VALVE REPLACEMENT, TRANSFEMORAL N/A 12/03/2019   Procedure: TRANSCATHETER AORTIC VALVE REPLACEMENT, TRANSFEMORAL;  Surgeon: Sherren Mocha, MD;  Location: [place] [facility];  Service: Open Heart Surgery;  Laterality: N/A;  . TUBAL LIGATION    . VULVA SURGERY      Current Medications: Outpatient Medications Prior to Visit  Medication Sig Dispense Refill  . amoxicillin (AMOXIL) 500 MG tablet Take 4 tablets (2,000 mg total) by mouth as directed. 1 hour prior to dental work including cleanings 12 tablet 12  . aspirin EC 81 MG tablet Take 1 tablet (81 mg total) by mouth daily.    . Cholecalciferol (VITAMIN D-3) 125 MCG (5000 UT) TABS Take 5,000 Units by mouth daily.    Marland Kitchen escitalopram (LEXAPRO) 10 MG tablet  Take 10 mg by mouth daily.     . ferrous sulfate 325 (65 FE) MG tablet Take 325 mg by mouth daily with breakfast.    . Misc Natural Products (OSTEO BI-FLEX JOINT SHIELD PO) Take 1 tablet by mouth daily.    . Multiple Vitamin (MULTIVITAMIN WITH MINERALS) TABS tablet Take 1 tablet by mouth daily. VitaFusion Women's Multivitamin    . Travoprost, BAK Free, (TRAVATAN) 0.004 % SOLN ophthalmic solution Place 1 drop into both eyes at bedtime.     Marland Kitchen umeclidinium-vilanterol (ANORO ELLIPTA) 62.5-25 MCG/INH AEPB Inhale 1 puff into the lungs daily.    . pantoprazole (PROTONIX) 40 MG tablet TAKE 1 TABLET BY MOUTH ONCE DAILY. 30 tablet 6   No facility-administered medications prior to visit.     Allergies:   Dorzolamide hcl-timolol mal and Novocain [procaine]   Social History   Socioeconomic History  . Marital status: Married    Spouse name: Not on file  . Number of children: 2  . Years of education: Not on file  . Highest education level: Not on file  Occupational History  . Not on file  Tobacco Use  . Smoking status: Former Smoker    Packs/day: 1.00    Years: 30.00    Pack years: 30.00    Types: Cigarettes  . Smokeless tobacco: Never Used  . Tobacco comment: Quit 30 years ago  Vaping Use  . Vaping Use: Never used  Substance and Sexual Activity  . Alcohol use: Yes    Comment: occasionally  . Drug use: No  . Sexual activity: Not on file  Other Topics Concern  . Not on file  Social History Narrative   Lives with husband.    Social Determinants of Health   Financial Resource Strain: Not on file  Food Insecurity: Not on file  Transportation Needs: Not on file  Physical Activity: Not on file  Stress: Not on file  Social Connections: Not on file     Family History:  The patient's family history includes CAD (age of onset: 56) in her brother; CAD (age of onset: 67) in her father.     ROS:   Please see the history of present illness.    ROS All other systems reviewed and are  negative.   PHYSICAL EXAM:   VS:  BP (!) 96/54   Pulse 76   Ht 5\' 6"  (1.676 m)   Wt 200 lb 9.6 oz (91 kg)   SpO2 95%   BMI 32.38 kg/m    GEN: Well nourished, well developed, in no acute distress HEENT: normal Neck: no JVD or masses Cardiac: RRR; soft flow murmur. No rubs, or gallops,no edema  Respiratory:  clear to auscultation bilaterally, normal work of breathing GI: soft, nontender, nondistended, + BS MS: no deformity or atrophy Skin: warm and dry, no rash.  Neuro:  Alert and Oriented x 3, Strength and sensation are intact Psych: euthymic mood, full affect   Wt Readings from Last 3 Encounters:  12/02/20 200 lb 9.6 oz (91 kg)  08/20/20 210 lb 9.6 oz (95.5 kg)  01/14/20 200 lb 6.4 oz (90.9 kg)      Studies/Labs Reviewed:   EKG:  EKG is NOT ordered today.    Recent Labs: 12/04/2019: Magnesium 1.8 12/11/2019: NT-Pro BNP 715 01/14/2020: ALT 11; BUN 12; Creatinine, Ser 0.73; Hemoglobin 12.1; Platelets 272; Potassium 4.1; Sodium 135   Lipid Panel    Component Value Date/Time   CHOL 221 (A) 06/01/2016 0000   TRIG 123 06/01/2016 0000   HDL 80 (A) 06/01/2016 0000   CHOLHDL 3 12/29/2009 0923   VLDL 27.2 12/29/2009 0923   LDLCALC 116 06/01/2016 0000   LDLDIRECT 133.4 12/29/2009 0923    Additional studies/ records that were reviewed today include:  TAVR OPERATIVE NOTE   Date of Procedure:12/03/2019  Preoperative Diagnosis:Severe Aortic Stenosis   Postoperative Diagnosis:Same   Procedure:   Transcatheter Aortic Valve Replacement - PercutaneousRightTransfemoral Approach Edwards Sapien 3 Ultra THV (size 73mm, model # 9750TFX, serial # [id number])  Co-Surgeons:Bryan Alveria Apley, MD and Sherren Mocha, MD   Anesthesiologist:K. Smith Robert, MD  Echocardiographer:M. Croituru, MD  Pre-operative Echo Findings: ? Severe aortic stenosis ? Normalleft  ventricular systolic function  Post-operative Echo Findings: ? Noparavalvular leak ? Normalleft ventricular systolic function  _____________   Echo 12/04/19: IMPRESSIONS  1. Left ventricular ejection fraction, by estimation, is 65 to 70%. The  left ventricle has normal function. The left ventricle has no regional  wall motion abnormalities. There is mild concentric left ventricular  hypertrophy. Left ventricular diastolic  parameters are consistent with Grade II diastolic dysfunction  (pseudonormalization). Elevated left atrial pressure.  2. Right ventricular systolic function is normal. The right ventricular  size is normal. There is normal pulmonary artery systolic pressure. The  estimated right ventricular systolic pressure is 42.7 mmHg.  3. Left atrial size was moderately dilated.  4. The mitral valve is normal in structure. No evidence of mitral valve  regurgitation. No evidence of mitral stenosis.  5. The aortic valve has been repaired/replaced. Aortic valve  regurgitation is not visualized. No aortic stenosis is present. There is a  26 mm Edwards Sapien prosthetic (TAVR) valve present in the aortic  position. Procedure Date: 12/03/2019. Echo  findings are consistent with normal structure and function of the aortic  valve prosthesis. Aortic valve mean gradient measures 12.0 mmHg. Aortic  valve Vmax measures 2.36 m/s.  6. The inferior vena cava is dilated in size with >50% respiratory  variability, suggesting right atrial pressure of 8 mmHg.   _____________   Echo 12/25/19: IMPRESSIONS   1. Left ventricular ejection fraction, by estimation, is 60 to 65%. The left ventricle has normal function. The left ventricle has no regional wall motion abnormalities. There is mild left ventricular hypertrophy. Left ventricular diastolic parameters  are consistent with Grade II diastolic dysfunction (pseudonormalization). Elevated left ventricular end-diastolic pressure. The  average left ventricular global longitudinal strain is -21.7 %. The global longitudinal strain is normal.  2. Right ventricular systolic function is normal. The right ventricular size is normal. There is normal pulmonary artery systolic pressure. The estimated right ventricular systolic pressure is 06.2 mmHg.  3. Left atrial size  was mild to moderately dilated.  4. The mitral valve is abnormal. Trivial mitral valve regurgitation.  5. The aortic valve has been repaired/replaced. Aortic valve regurgitation is not visualized. There is a 26 mm Edwards Edwards Sapien prosthetic (TAVR) valve present in the aortic position. Procedure Date: 12/04/19. Echo findings are consistent with  normal structure and function of the aortic valve prosthesis. Aortic valve area, by VTI measures 1.77 cm. Aortic valve mean gradient measures 11.0 mmHg. Aortic valve Vmax measures 2.12 m/s.  6. The inferior vena cava is dilated in size with >50% respiratory variability, suggesting right atrial pressure of 8 mmHg.   ____________________   Echo 12/02/20 IMPRESSIONS 1. Left ventricular ejection fraction, by estimation, is 65 to 70%. Left  ventricular ejection fraction by 3D volume is 71 %. The left ventricle has  normal function. The left ventricle has no regional wall motion  abnormalities. Left ventricular diastolic parameters are consistent with Grade II diastolic dysfunction  (pseudonormalization). Elevated left ventricular end-diastolic pressure.  2. Right ventricular systolic function is normal. The right ventricular  size is mildly enlarged. There is normal pulmonary artery systolic  pressure.  3. Left atrial size was mildly dilated.  4. Right atrial size was moderately dilated.  5. The mitral valve is normal in structure. No evidence of mitral valve  regurgitation. No evidence of mitral stenosis.  6. The aortic valve has been repaired/replaced. There is a 26 mm Sapien  prosthetic, stented (TAVR) valve  present in the aortic position. Aortic  valve regurgitation is not visualized. No aortic stenosis is present.  Aortic valve mean gradient measures 8.5  mmHg. Aortic valve Vmax measures 2.18 m/s.  7. Aortic dilatation noted. There is mild dilatation of the ascending  aorta, measuring 40 mm.  8. The inferior vena cava is normal in size with greater than 50%  respiratory variability, suggesting right atrial pressure of 3 mmHg.    ASSESSMENT & PLAN:   Severe AS s/p TAVR: echo today shows EF 65%, normally functioning TAVR with a mean gradient of 8.5 mm hg and no PVL. She has NYHA class III symptoms with continued dyspnea with minimal exertion. SBE prophylaxis discussed; she has amoxicillin. Continue on aspirin daily   Chronic diastolic CHF: she appears euvolemic off diuretics.    Hiatal hernia: recent CT showed complete herniation of stomach into chest. Dr. Kipp Brood planned robotic repair of her hiatal hernia but pt cancelled. She would like a second opinion at [facility]. We have sent this referral in .   Medication Adjustments/Labs and Tests Ordered: Current medicines are reviewed at length with the patient today.  Concerns regarding medicines are outlined above.  Medication changes, Labs and Tests ordered today are listed in the Patient Instructions below. Patient Instructions  Medication Instructions:  Your physician recommends that you continue on your current medications as directed. Please refer to the Current Medication list given to you today.  *If you need a refill on your cardiac medications before your next appointment, please call your pharmacy*   Lab Work: If you have labs (blood work) drawn today and your tests are completely normal, you will receive your results only by: Marland Kitchen MyChart Message (if you have MyChart) OR . A paper copy in the mail If you have any lab test that is abnormal or we need to change your treatment, we will call you to review the results.  Follow-Up: At  [facility], you and your health needs are our priority.  As part of our continuing mission  to provide you with exceptional heart care, we have created designated Provider Care Teams.  These Care Teams include your primary Cardiologist (physician) and Advanced Practice Providers (APPs -  Physician Assistants and Nurse Practitioners) who all work together to provide you with the care you need, when you need it.  We recommend signing up for the patient portal called "MyChart".  Sign up information is provided on this After Visit Summary.  MyChart is used to connect with patients for Virtual Visits (Telemedicine).  Patients are able to view lab/test results, encounter notes, upcoming appointments, etc.  Non-urgent messages can be sent to your provider as well.   To learn more about what you can do with MyChart, go to [email or web address].    Your next appointment:   6 month(s)  The format for your next appointment:   In Person  Provider:   You may see Dr. Rayann Heman or one of the following Advanced Practice Providers on your designated Care Team:    Chanetta Marshall, NP  Tommye Standard, PA-C  Legrand Como "Jonni Sanger" [place], [place]    Other Instructions You have been referred to [facility] Cardiothoracic Surgeon Dr. Cheree Ditto.      Signed, Angelena Form, PA-C  12/02/2020 3:53 PM    [facility] [address], [place], [place]  [address] Phone: [phone]; Fax: [phone]

## 2020-12-02 ENCOUNTER — Encounter: Payer: Self-pay | Admitting: Physician Assistant

## 2020-12-02 ENCOUNTER — Ambulatory Visit (HOSPITAL_COMMUNITY): Payer: Medicare PPO | Attending: Internal Medicine

## 2020-12-02 ENCOUNTER — Other Ambulatory Visit: Payer: Self-pay

## 2020-12-02 ENCOUNTER — Ambulatory Visit (INDEPENDENT_AMBULATORY_CARE_PROVIDER_SITE_OTHER): Payer: Medicare PPO | Admitting: Physician Assistant

## 2020-12-02 VITALS — BP 96/54 | HR 76 | Ht 66.0 in | Wt 200.6 lb

## 2020-12-02 DIAGNOSIS — K449 Diaphragmatic hernia without obstruction or gangrene: Secondary | ICD-10-CM

## 2020-12-02 DIAGNOSIS — Z952 Presence of prosthetic heart valve: Secondary | ICD-10-CM

## 2020-12-02 DIAGNOSIS — I5032 Chronic diastolic (congestive) heart failure: Secondary | ICD-10-CM | POA: Diagnosis not present

## 2020-12-02 LAB — ECHOCARDIOGRAM COMPLETE
AR max vel: 1.65 cm2
AV Area VTI: 1.61 cm2
AV Area mean vel: 1.8 cm2
AV Mean grad: 8.5 mmHg
AV Peak grad: 19.1 mmHg
Ao pk vel: 2.19 m/s
Area-P 1/2: 3.02 cm2
S' Lateral: 2.4 cm

## 2020-12-02 MED ORDER — PANTOPRAZOLE SODIUM 40 MG PO TBEC: 1.0000 | DELAYED_RELEASE_TABLET | Freq: Every day | ORAL | 3 refills | Status: DC

## 2020-12-02 NOTE — Patient Instructions (Addendum)
Medication Instructions:  Your physician recommends that you continue on your current medications as directed. Please refer to the Current Medication list given to you today.  *If you need a refill on your cardiac medications before your next appointment, please call your pharmacy*   Lab Work: If you have labs (blood work) drawn today and your tests are completely normal, you will receive your results only by: Marland Kitchen MyChart Message (if you have MyChart) OR . A paper copy in the mail If you have any lab test that is abnormal or we need to change your treatment, we will call you to review the results.  Follow-Up: At [facility], you and your health needs are our priority.  As part of our continuing mission to provide you with exceptional heart care, we have created designated Provider Care Teams.  These Care Teams include your primary Cardiologist (physician) and Advanced Practice Providers (APPs -  Physician Assistants and Nurse Practitioners) who all work together to provide you with the care you need, when you need it.  We recommend signing up for the patient portal called "MyChart".  Sign up information is provided on this After Visit Summary.  MyChart is used to connect with patients for Virtual Visits (Telemedicine).  Patients are able to view lab/test results, encounter notes, upcoming appointments, etc.  Non-urgent messages can be sent to your provider as well.   To learn more about what you can do with MyChart, go to [email or web address].    Your next appointment:   6 month(s)  The format for your next appointment:   In Person  Provider:   You may see Dr. Rayann Heman or one of the following Advanced Practice Providers on your designated Care Team:    Chanetta Marshall, NP  Tommye Standard, PA-C  Legrand Como "Jonni Sanger" [place], [place]    Other Instructions You have been referred to [facility] Cardiothoracic Surgeon Dr. Cheree Ditto.

## 2021-09-30 ENCOUNTER — Other Ambulatory Visit: Payer: Self-pay | Admitting: Physician Assistant

## 2022-01-05 ENCOUNTER — Other Ambulatory Visit: Payer: Self-pay | Admitting: Physician Assistant

## 2022-01-18 IMAGING — CR DG CHEST 2V
2 series · 2 of 2 positions shown · non-contrast
Comparison: 06/13/2016

CLINICAL DATA: Preoperative evaluation for TAVR, shortness of
breath, central chest pain, hypertension, vulvar cancer, former
smoker

EXAM:
CHEST - 2 VIEW

[w chest pa]
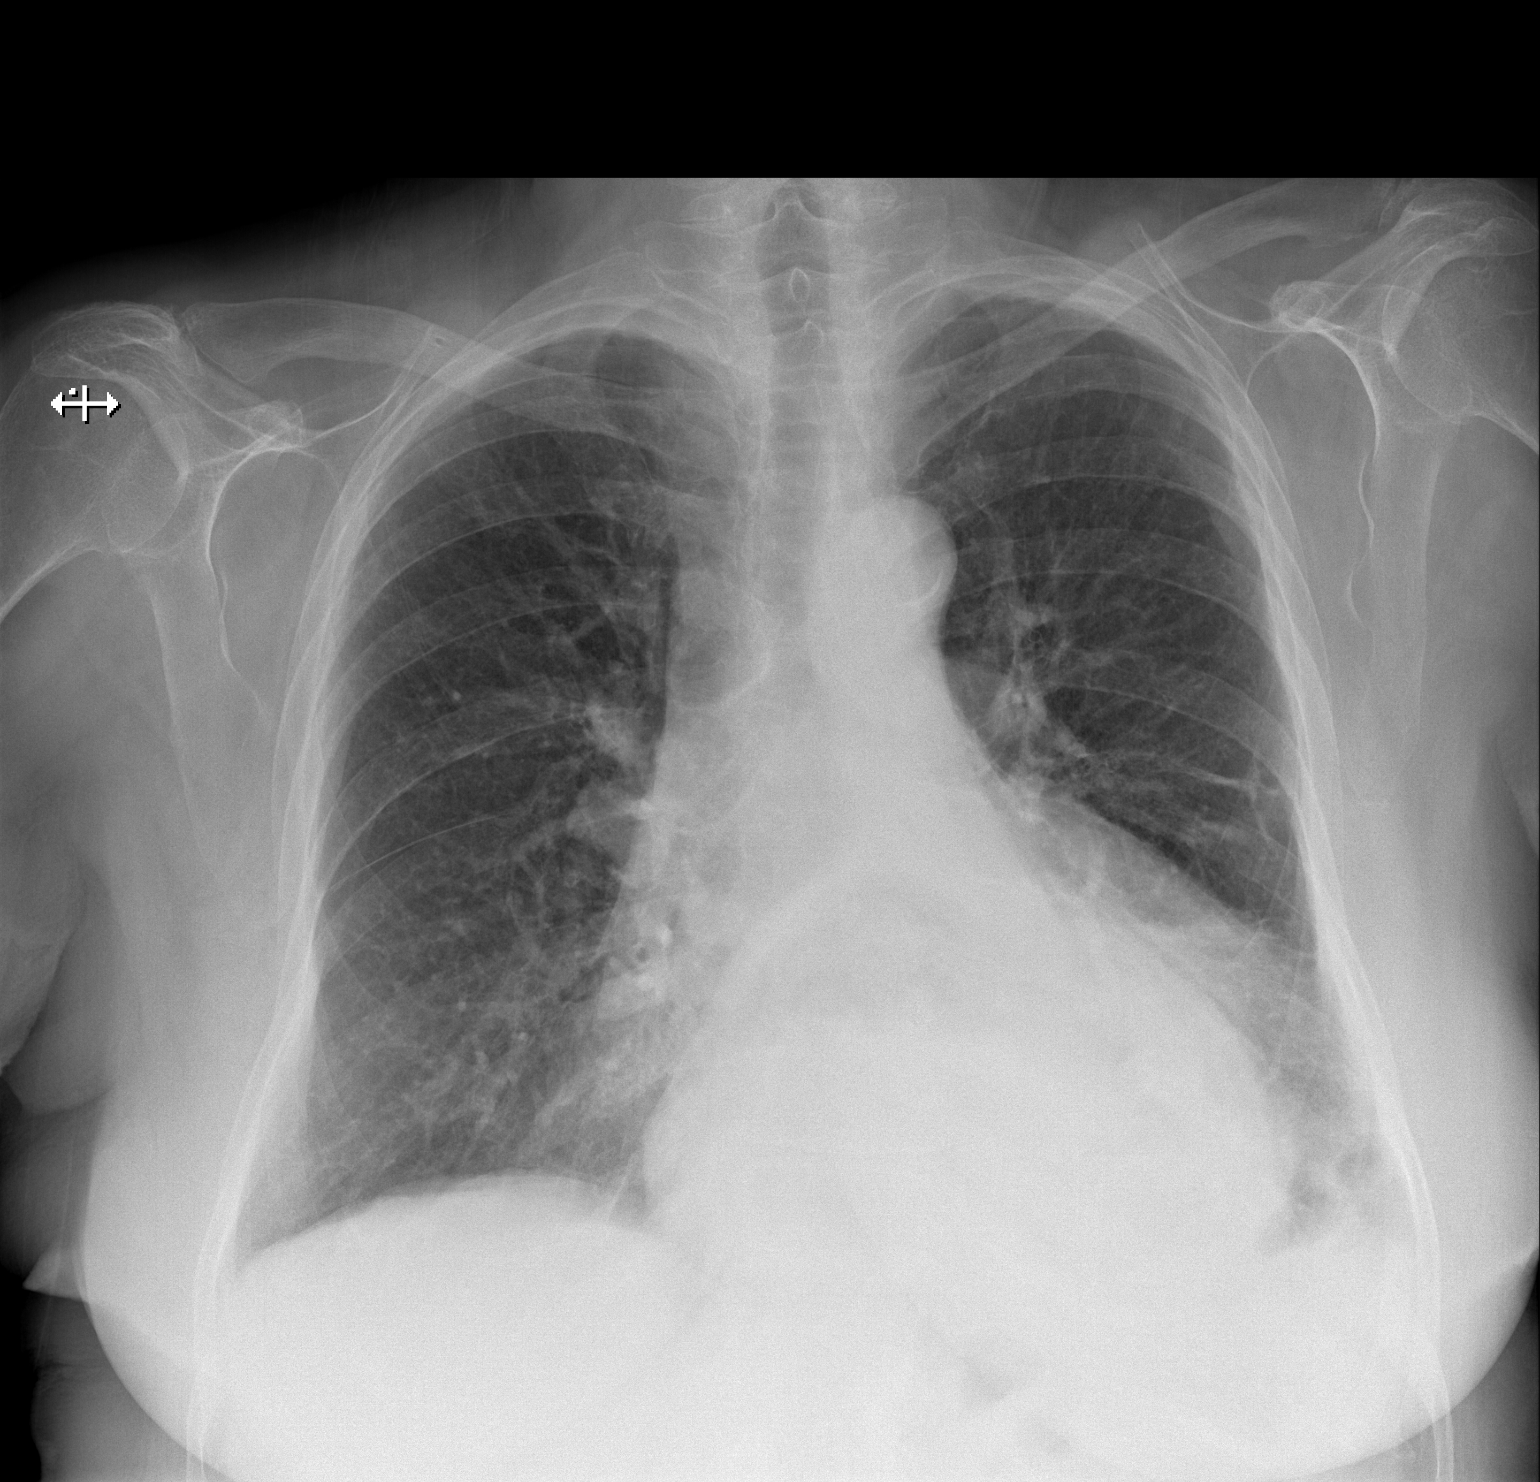

[w chest lat]
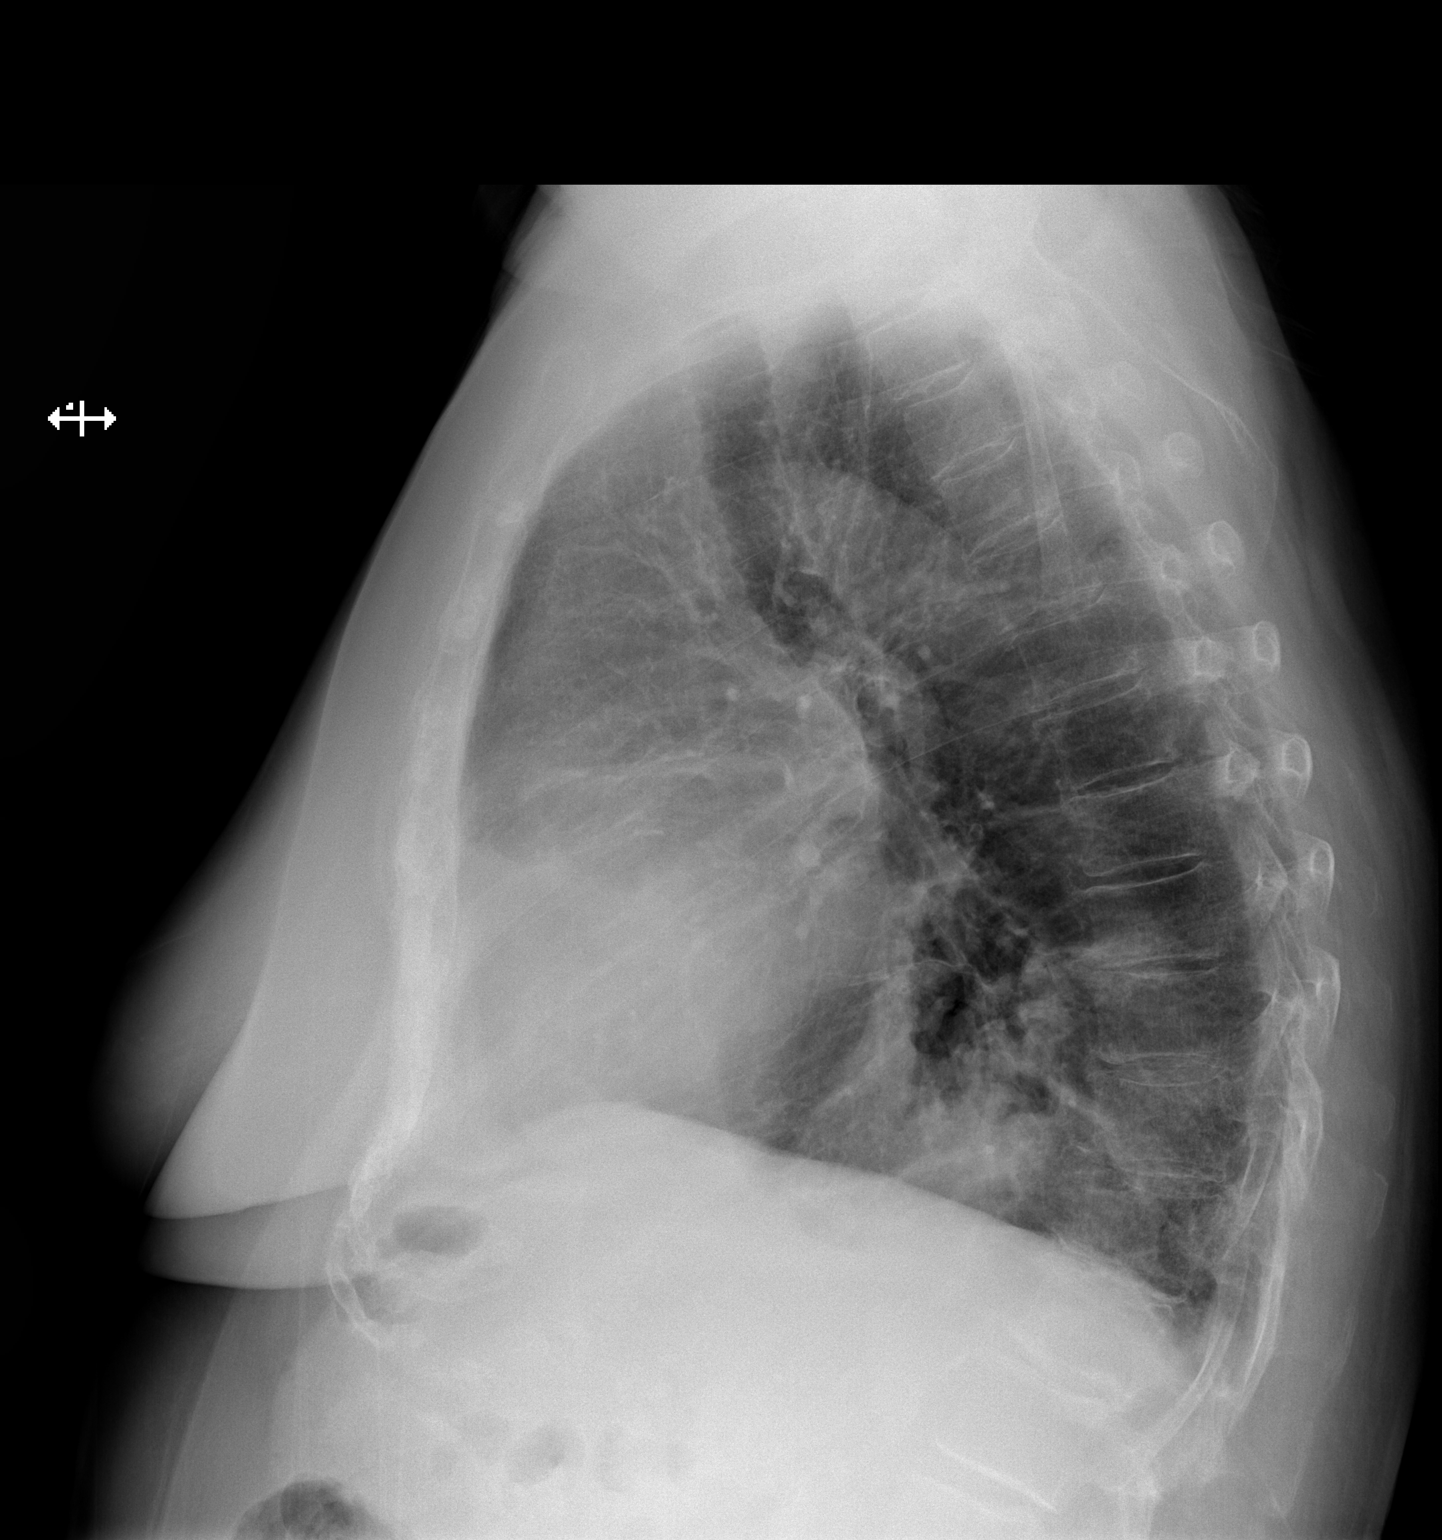

[2 of 2 positions shown; findings below may reference images not displayed]

FINDINGS: Enlargement of cardiac silhouette.

Large hiatal hernia with air-fluid level.

Atherosclerotic calcification aorta.

Pulmonary vascularity normal.

LEFT basilar atelectasis.

No pulmonary infiltrate, pleural effusion or pneumothorax.

Bones demineralized with age-indeterminate height loss of T12
vertebral body.
IMPRESSION: Enlargement of cardiac silhouette.

Large hiatal hernia.

LEFT basilar atelectasis.

## 2022-01-22 IMAGING — DX DG CHEST 1V PORT
1 series · 1 of 1 positions shown · non-contrast
Comparison: November 29, 2019.

CLINICAL DATA: Status post transcatheter aortic valve repair.

EXAM:
PORTABLE CHEST 1 VIEW

[chest ap]
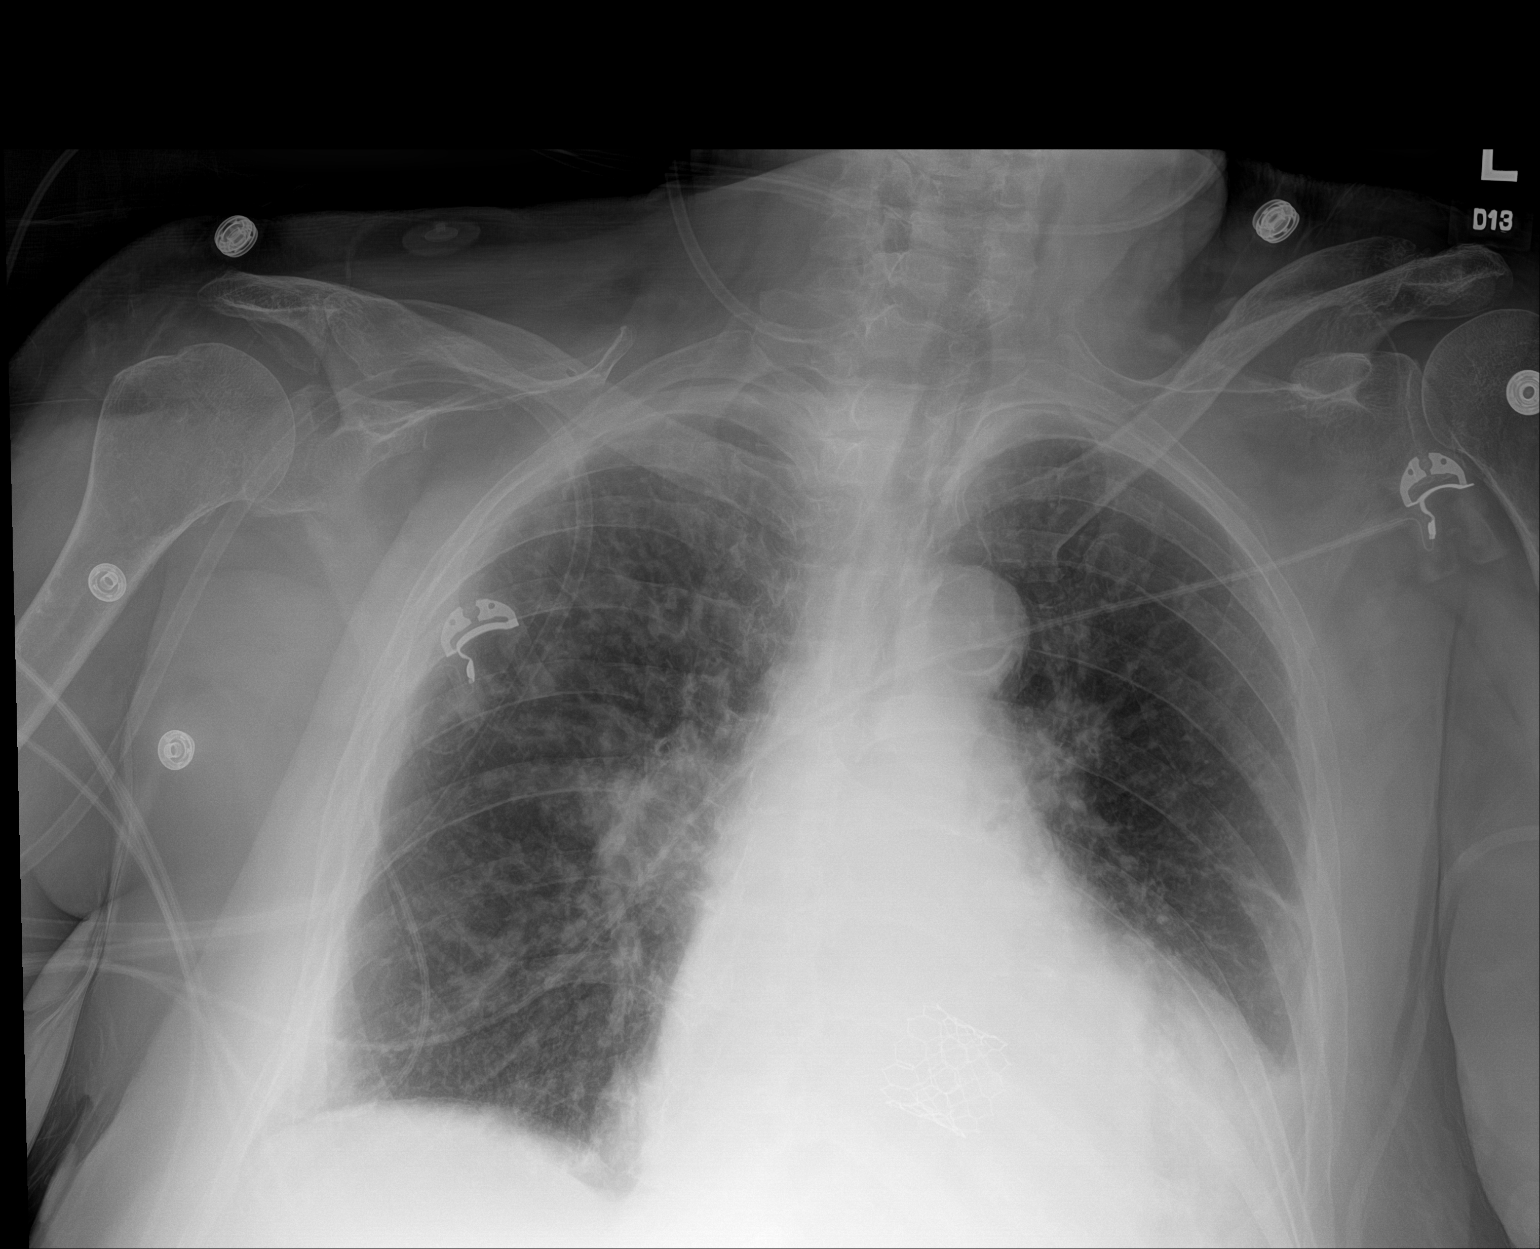

[1 of 1 positions shown; findings below may reference images not displayed]

FINDINGS: Stable cardiomegaly. Mild central pulmonary vascular congestion is
noted. Aortic valve prosthesis is noted. No pneumothorax is noted.
Mild left basilar atelectasis and/or effusion is noted. Bony thorax
is unremarkable.
IMPRESSION: Stable cardiomegaly and central pulmonary vascular congestion. Mild
left basilar atelectasis and/or effusion is noted.

## 2022-06-02 NOTE — Progress Notes (Deleted)
Electrophysiology Office Follow up Visit Note:    Date:  06/02/2022   ID:  Mackenzie Jensen, DOB [birth year], MRN [id number]  PCP:  Moshe Cipro, MD  [facility] Cardiologist:  None  [facility] Electrophysiologist:  Vickie Epley, MD    Interval History:    Mackenzie Jensen is a 88 y.o. female who presents for a follow up visit. They were last seen in clinic August 20, 2020 by Dr. Rayann Heman.  She has a history of aortic stenosis post TAVR.      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 Burt Knack and Dr. Cyndia Bent   Severe aortic stenosis    Skin cancer    Vulvar cancer [facility])     Past Surgical History:  Procedure Laterality Date   COSMETIC SURGERY     EYE SURGERY     glaucoma 4-5 years ago   RIGHT/LEFT HEART CATH AND CORONARY ANGIOGRAPHY N/A 10/29/2019   Procedure: RIGHT/LEFT HEART CATH AND CORONARY ANGIOGRAPHY;  Surgeon: Belva Crome, MD;  Location: [place] [facility];  Service: Cardiovascular;  Laterality: N/A;   TEE WITHOUT CARDIOVERSION N/A 12/03/2019   Procedure: TRANSESOPHAGEAL ECHOCARDIOGRAM (TEE);  Surgeon: Sherren Mocha, MD;  Location: [place] [facility];  Service: Open Heart Surgery;  Laterality: N/A;   TONSILLECTOMY AND ADENOIDECTOMY     TRANSCATHETER AORTIC VALVE REPLACEMENT, TRANSFEMORAL N/A 12/03/2019   Procedure: TRANSCATHETER AORTIC VALVE REPLACEMENT, TRANSFEMORAL;  Surgeon: Sherren Mocha, MD;  Location: [place] [facility];  Service: Open Heart Surgery;  Laterality: N/A;   TUBAL LIGATION     VULVA SURGERY      Current Medications: No outpatient medications have been marked as taking for the 06/03/22 encounter (Appointment) with Vickie Epley, MD.     Allergies:   Dorzolamide  hcl-timolol mal and Novocain [procaine]   Social History   Socioeconomic History   Marital status: Married    Spouse name: Not on file   Number of children: 2   Years of education: Not on file   Highest education level: Not on file  Occupational History   Not on file  Tobacco Use   Smoking status: Former    Packs/day: 1.00    Years: 30.00    Total pack years: 30.00    Types: Cigarettes   Smokeless tobacco: Never   Tobacco comments:    Quit 30 years ago  Vaping Use   Vaping Use: Never used  Substance and Sexual Activity   Alcohol use: Yes    Comment: occasionally   Drug use: No   Sexual activity: Not on file  Other Topics Concern   Not on file  Social History Narrative   Lives with husband.    Social Determinants of Health   Financial Resource Strain: Not on file  Food Insecurity: Not on file  Transportation Needs: Not on file  Physical Activity: Not on file  Stress: Not on file  Social Connections: Not on file     Family History: The patient's family history includes CAD (age of onset: 35) in her brother; CAD (age of onset: 6) in her father.  ROS:   Please see the history of present illness.    All other systems reviewed and are negative.  EKGs/Labs/Other Studies Reviewed:    The following studies were reviewed today:    Recent Labs: No results found for requested labs within last 365 days.  Recent Lipid Panel    Component Value Date/Time   CHOL 221 (A) 06/01/2016 0000   TRIG 123 06/01/2016 0000   HDL 80 (A) 06/01/2016 0000   CHOLHDL 3 12/29/2009 0923   VLDL 27.2 12/29/2009 0923   LDLCALC 116 06/01/2016 0000   LDLDIRECT 133.4 12/29/2009 0923    Physical Exam:    VS:  There were no vitals taken for this visit.    Wt Readings from Last 3 Encounters:  12/02/20 200 lb 9.6 oz (91 kg)  08/20/20 210 lb 9.6 oz (95.5 kg)  01/14/20 200 lb 6.4 oz (90.9 kg)     GEN: *** Well nourished, well developed in no acute distress HEENT: Normal NECK: No  JVD; No carotid bruits LYMPHATICS: No lymphadenopathy CARDIAC: ***RRR, no murmurs, rubs, gallops RESPIRATORY:  Clear to auscultation without rales, wheezing or rhonchi  ABDOMEN: Soft, non-tender, non-distended MUSCULOSKELETAL:  No edema; No deformity  SKIN: Warm and dry NEUROLOGIC:  Alert and oriented x 3 PSYCHIATRIC:  Normal affect        ASSESSMENT:    No diagnosis found. PLAN:    In order of problems listed above:           Total time spent with patient today *** minutes. This includes reviewing records, evaluating the patient and coordinating care.   Medication Adjustments/Labs and Tests Ordered: Current medicines are reviewed at length with the patient today.  Concerns regarding medicines are outlined above.  No orders of the defined types were placed in this encounter.  No orders of the defined types were placed in this encounter.    Signed, Lars Mage, MD, [facility], [facility] 06/02/2022 9:40 PM    Electrophysiology [facility]

## 2022-06-03 ENCOUNTER — Encounter: Payer: Self-pay | Admitting: Cardiology

## 2022-06-03 ENCOUNTER — Ambulatory Visit: Payer: Medicare PPO | Attending: Cardiology | Admitting: Cardiology

## 2022-06-03 VITALS — BP 124/68 | HR 76 | Ht 66.0 in | Wt 216.0 lb

## 2022-06-03 DIAGNOSIS — I5032 Chronic diastolic (congestive) heart failure: Secondary | ICD-10-CM

## 2022-06-03 DIAGNOSIS — Z952 Presence of prosthetic heart valve: Secondary | ICD-10-CM | POA: Diagnosis not present

## 2022-06-03 NOTE — Progress Notes (Signed)
Electrophysiology Office Follow up Visit Note:    Date:  06/03/2022   ID:  [name], DOB 06/01/1934, MRN [id number]  PCP:  Mackenzie Cipro, MD  [facility] Cardiologist:  None  [facility] Electrophysiologist:  Vickie Epley, MD    Interval History:    Mackenzie Jensen is a 88 y.o. female who presents for a follow up visit. They were last seen in clinic August 20, 2020 by Dr. Rayann Jensen.  She has a history of aortic stenosis post TAVR.  Patient reports that she is doing much better in regards to her shortness of breath since having a hernia repair with Mackenzie Jensen ~1 year ago. She states that she was not in the hospital for long after her surgery.  Overall, she is doing well from a cardiac perspective. She presents today to re-establish cardiology care.  She notes that she has gained some weight. She is not very active at this time as she has been caring for her husband who had post-shingles encephalitis.      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 Burt Knack and Dr. Cyndia Bent   Severe aortic stenosis    Skin cancer    Vulvar cancer [facility])     Past Surgical History:  Procedure Laterality Date   COSMETIC SURGERY     EYE SURGERY     glaucoma 4-5 years ago   RIGHT/LEFT HEART CATH AND CORONARY ANGIOGRAPHY N/A 10/29/2019   Procedure: RIGHT/LEFT HEART CATH AND CORONARY ANGIOGRAPHY;  Surgeon: Mackenzie Crome, MD;  Location: [facility];  Service: Cardiovascular;  Laterality: N/A;   TEE WITHOUT CARDIOVERSION N/A 12/03/2019   Procedure: TRANSESOPHAGEAL ECHOCARDIOGRAM (TEE);  Surgeon: Mackenzie Mocha, MD;  Location: [facility];  Service: Open Heart Surgery;  Laterality: N/A;   TONSILLECTOMY AND  ADENOIDECTOMY     TRANSCATHETER AORTIC VALVE REPLACEMENT, TRANSFEMORAL N/A 12/03/2019   Procedure: TRANSCATHETER AORTIC VALVE REPLACEMENT, TRANSFEMORAL;  Surgeon: Mackenzie Mocha, MD;  Location: [place] [facility];  Service: Open Heart Surgery;  Laterality: N/A;   TUBAL LIGATION     VULVA SURGERY      Current Medications: Current Meds  Medication Sig   ALPRAZolam (XANAX) 0.25 MG tablet Take by mouth.   amoxicillin (AMOXIL) 500 MG tablet Take 4 tablets (2,000 mg total) by mouth as directed. 1 hour prior to dental work including cleanings   atorvastatin (LIPITOR) 20 MG tablet Take 20 mg by mouth daily.   brimonidine (ALPHAGAN P) 0.1 % SOLN Apply to eye.   Cholecalciferol (VITAMIN D-3) 125 MCG (5000 UT) TABS Take 5,000 Units by mouth daily.   escitalopram (LEXAPRO) 10 MG tablet Take 10 mg by mouth daily.    estradiol (ESTRACE) 0.1 MG/GM vaginal cream Place vaginally.   ferrous sulfate 325 (65 FE) MG tablet Take 325 mg by mouth daily with breakfast.   metoprolol succinate (TOPROL-XL) 25 MG 24 hr tablet Take 25 mg by mouth every morning.   Misc Natural Products (OSTEO BI-FLEX JOINT SHIELD PO) Take 1 tablet by mouth daily.   Multiple Vitamin (MULTIVITAMIN WITH MINERALS) TABS tablet Take 1 tablet by mouth  daily. VitaFusion Women's Multivitamin   pantoprazole (PROTONIX) 40 MG tablet TAKE 1 TABLET BY MOUTH ONCE DAILY.   Travoprost, BAK Free, (TRAVATAN) 0.004 % SOLN ophthalmic solution Place 1 drop into both eyes at bedtime.    umeclidinium-vilanterol (ANORO ELLIPTA) 62.5-25 MCG/INH AEPB Inhale 1 puff into the lungs daily.     Allergies:   Dorzolamide hcl-timolol mal and Novocain [procaine]   Social History   Socioeconomic History   Marital status: Married    Spouse name: Not on file   Number of children: 2   Years of education: Not on file   Highest education level: Not on file  Occupational History   Not on file  Tobacco Use   Smoking status: Former    Packs/day: 1.00    Years: 30.00     Total pack years: 30.00    Types: Cigarettes   Smokeless tobacco: Never   Tobacco comments:    Quit 30 years ago  Vaping Use   Vaping Use: Never used  Substance and Sexual Activity   Alcohol use: Yes    Comment: occasionally   Drug use: No   Sexual activity: Not on file  Other Topics Concern   Not on file  Social History Narrative   Lives with husband.    Social Determinants of Health   Financial Resource Strain: Not on file  Food Insecurity: Not on file  Transportation Needs: Not on file  Physical Activity: Not on file  Stress: Not on file  Social Connections: Not on file     Family History: The patient's family history includes CAD (age of onset: 62) in her brother; CAD (age of onset: 51) in her father.  ROS:   Please see the history of present illness.    All other systems reviewed and are negative.  EKGs/Labs/Other Studies Reviewed:    The following studies were reviewed today:    Recent Labs: No results found for requested labs within last 365 days.  Recent Lipid Panel    Component Value Date/Time   CHOL 221 (A) 06/01/2016 0000   TRIG 123 06/01/2016 0000   HDL 80 (A) 06/01/2016 0000   CHOLHDL 3 12/29/2009 0923   VLDL 27.2 12/29/2009 0923   LDLCALC 116 06/01/2016 0000   LDLDIRECT 133.4 12/29/2009 0923    Physical Exam:    VS:  BP 124/68   Pulse 76   Ht '5\' 6"'$  (1.676 m)   Wt 216 lb (98 kg)   SpO2 95%   BMI 34.86 kg/m     Wt Readings from Last 3 Encounters:  06/03/22 216 lb (98 kg)  12/02/20 200 lb 9.6 oz (91 kg)  08/20/20 210 lb 9.6 oz (95.5 kg)     GEN:  Well nourished, well developed in no acute distress HEENT: Normal NECK: No JVD; No carotid bruits LYMPHATICS: No lymphadenopathy CARDIAC: RRR, no murmurs, rubs, gallops RESPIRATORY:  Clear to auscultation without rales, wheezing or rhonchi  ABDOMEN: Soft, non-tender, non-distended MUSCULOSKELETAL:  No edema; No deformity  SKIN: Warm and dry NEUROLOGIC:  Alert and oriented x  3 PSYCHIATRIC:  Normal affect        ASSESSMENT:    1. Chronic diastolic CHF (congestive heart failure) ([place])   2. S/P TAVR (transcatheter aortic valve replacement)    PLAN:    In order of problems listed above:  #Chronic diastolic CHF Doing well without any shortness of breath.  #S/P TAVR Prosthesis appears to be functioning well. No signs of HF.  Follow up  in 1 year with gen card APP.    Medication Adjustments/Labs and Tests Ordered: Current medicines are reviewed at length with the patient today.  Concerns regarding medicines are outlined above.  No orders of the defined types were placed in this encounter.  No orders of the defined types were placed in this encounter.  This document serves as a record of services personally performed by Lars Mage, MD. It was created on her behalf by Eugene Gavia, a trained medical scribe. The creation of this record is based on the scribe's personal observations and the provider's statements to them. This document has been checked and approved by the attending provider.  I, Vickie Epley, MD, have reviewed all documentation for this visit. The documentation on 06/03/22 for the exam, diagnosis, procedures, and orders are all accurate and complete.    Signed, Lars Mage, MD, [facility], [facility] 06/03/2022 12:06 PM    Electrophysiology [facility]

## 2022-06-03 NOTE — Patient Instructions (Signed)
Medication Instructions:  none *If you need a refill on your cardiac medications before your next appointment, please call your pharmacy*   Lab Work: none If you have labs (blood work) drawn today and your tests are completely normal, you will receive your results only by: MyChart Message (if you have MyChart) OR A paper copy in the mail If you have any lab test that is abnormal or we need to change your treatment, we will call you to review the results.   Testing/Procedures: none   Follow-Up: At [facility], you and your health needs are our priority.  As part of our continuing mission to provide you with exceptional heart care, we have created designated Provider Care Teams.  These Care Teams include your primary Cardiologist (physician) and Advanced Practice Providers (APPs -  Physician Assistants and Nurse Practitioners) who all work together to provide you with the care you need, when you need it.  We recommend signing up for the patient portal called "MyChart".  Sign up information is provided on this After Visit Summary.  MyChart is used to connect with patients for Virtual Visits (Telemedicine).  Patients are able to view lab/test results, encounter notes, upcoming appointments, etc.  Non-urgent messages can be sent to your provider as well.   To learn more about what you can do with MyChart, go to [email or web address].    Your next appointment:   1 year(s)  The format for your next appointment:   In Person  Provider:   You will see one of the following Advanced Practice Providers on your designated Care Team:   Renee Ursuy, PA-C Michael "Andy" Tillery, PA-C      Other Instructions none  Important Information About Sugar
